# Patient Record
Sex: Male | Born: 1995 | Race: White | Hispanic: No | Marital: Single | Smoking: Never smoker
Health system: Southern US, Community
[De-identification: ages and names within clinical notes are randomized; demographics above are authoritative.]

## PROBLEM LIST (undated history)

## (undated) DIAGNOSIS — G43909 Migraine, unspecified, not intractable, without status migrainosus: Secondary | ICD-10-CM

## (undated) HISTORY — DX: Migraine, unspecified, not intractable, without status migrainosus: G43.909

---
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
## 2018-06-26 DIAGNOSIS — F4320 Adjustment disorder, unspecified: Secondary | ICD-10-CM | POA: Diagnosis not present

## 2018-07-03 DIAGNOSIS — F4320 Adjustment disorder, unspecified: Secondary | ICD-10-CM | POA: Diagnosis not present

## 2018-07-07 ENCOUNTER — Ambulatory Visit (INDEPENDENT_AMBULATORY_CARE_PROVIDER_SITE_OTHER): Payer: BLUE CROSS/BLUE SHIELD | Admitting: Family Medicine

## 2018-07-07 ENCOUNTER — Encounter: Payer: Self-pay | Admitting: Family Medicine

## 2018-07-07 VITALS — BP 102/80 | HR 68 | Temp 97.8°F | Ht 75.0 in | Wt 205.0 lb

## 2018-07-07 DIAGNOSIS — R5383 Other fatigue: Secondary | ICD-10-CM | POA: Diagnosis not present

## 2018-07-07 DIAGNOSIS — B079 Viral wart, unspecified: Secondary | ICD-10-CM | POA: Diagnosis not present

## 2018-07-07 DIAGNOSIS — Z7689 Persons encountering health services in other specified circumstances: Secondary | ICD-10-CM | POA: Diagnosis not present

## 2018-07-07 DIAGNOSIS — F32A Depression, unspecified: Secondary | ICD-10-CM

## 2018-07-07 DIAGNOSIS — F329 Major depressive disorder, single episode, unspecified: Secondary | ICD-10-CM

## 2018-07-07 DIAGNOSIS — E559 Vitamin D deficiency, unspecified: Secondary | ICD-10-CM

## 2018-07-07 DIAGNOSIS — F419 Anxiety disorder, unspecified: Secondary | ICD-10-CM | POA: Diagnosis not present

## 2018-07-07 LAB — CBC WITH DIFFERENTIAL/PLATELET
BASOS ABS: 0 10*3/uL (ref 0.0–0.1)
Basophils Relative: 0.7 % (ref 0.0–3.0)
EOS ABS: 0.1 10*3/uL (ref 0.0–0.7)
Eosinophils Relative: 2.1 % (ref 0.0–5.0)
HEMATOCRIT: 48.7 % (ref 39.0–52.0)
HEMOGLOBIN: 16.6 g/dL (ref 13.0–17.0)
LYMPHS PCT: 42.5 % (ref 12.0–46.0)
Lymphs Abs: 2.4 10*3/uL (ref 0.7–4.0)
MCHC: 34 g/dL (ref 30.0–36.0)
MCV: 85.7 fl (ref 78.0–100.0)
MONO ABS: 0.5 10*3/uL (ref 0.1–1.0)
Monocytes Relative: 8.7 % (ref 3.0–12.0)
Neutro Abs: 2.6 10*3/uL (ref 1.4–7.7)
Neutrophils Relative %: 46 % (ref 43.0–77.0)
Platelets: 242 10*3/uL (ref 150.0–400.0)
RBC: 5.68 Mil/uL (ref 4.22–5.81)
RDW: 12.7 % (ref 11.5–15.5)
WBC: 5.7 10*3/uL (ref 4.0–10.5)

## 2018-07-07 LAB — BASIC METABOLIC PANEL
BUN: 15 mg/dL (ref 6–23)
CHLORIDE: 103 meq/L (ref 96–112)
CO2: 31 mEq/L (ref 19–32)
Calcium: 9.7 mg/dL (ref 8.4–10.5)
Creatinine, Ser: 1 mg/dL (ref 0.40–1.50)
GFR: 98.59 mL/min (ref >60.00)
GLUCOSE: 94 mg/dL (ref 70–99)
POTASSIUM: 4.4 meq/L (ref 3.5–5.1)
SODIUM: 141 meq/L (ref 135–145)

## 2018-07-07 LAB — VITAMIN D 25 HYDROXY (VIT D DEFICIENCY, FRACTURES): VITD: 18.98 ng/mL — AB (ref 30.00–100.00)

## 2018-07-07 LAB — TSH: TSH: 2.33 u[IU]/mL (ref 0.35–4.50)

## 2018-07-07 LAB — T4, FREE: Free T4: 0.78 ng/dL (ref 0.60–1.60)

## 2018-07-07 MED ORDER — SERTRALINE HCL 25 MG PO TABS: ORAL_TABLET | ORAL | 1 refills | Status: DC

## 2018-07-07 MED ORDER — VITAMIN D (ERGOCALCIFEROL) 1.25 MG (50000 UNIT) PO CAPS: 50000.0000 [IU] | ORAL_CAPSULE | ORAL | 0 refills | Status: DC

## 2018-07-07 NOTE — Patient Instructions (Signed)
Cryoablation, Care After This sheet gives you information about how to care for yourself after your procedure. Your health care provider may also give you more specific instructions. If you have problems or questions, contact your health care provider. What can I expect after the procedure? After the procedure, it is common to have:  Soreness around the treatment area.  Mild pain and swelling in the treatment area.  Follow these instructions at home: Treatment area care   Follow instructions from your health care provider about how to take care of your incision. Make sure you: ? Wash your hands with soap and water before you change your bandage (dressing). If soap and water are not available, use hand sanitizer. ? Change your dressing as told by your health care provider. ? Leave stitches (sutures) in place. They may need to stay in place for 2 weeks or longer.  Check your treatment area every day for signs of infection. Check for: ? More redness, swelling, or pain. ? More fluid or blood. ? Warmth. ? Pus or a bad smell.  Keep the treated area clean, dry, and covered with a dressing until it has healed. Clean the area with soap and water or as told by your health care provider.  You may shower if your health care provider approves. If your bandage gets wet, change it right away. Activity  Follow instructions from your health care provider about any activity limitations.  Do not drive for 24 hours if you received a medicine to help you relax (sedative). General instructions  Take over-the-counter and prescription medicines only as told by your health care provider.  Keep all follow-up visits as told by your health care provider. This is important. Contact a health care provider if:  You do not have a bowel movement for 2 days.  You have nausea or vomiting.  You have more redness, swelling, or pain around your treatment area.  You have more fluid or blood coming from your  treatment area.  Your treatment area feels warm to the touch.  You have pus or a bad smell coming from your treatment area.  You have a fever. Get help right away if:  You have severe pain.  You have trouble swallowing or breathing.  You have severe weakness or dizziness.  You have chest pain or shortness of breath. This information is not intended to replace advice given to you by your health care provider. Make sure you discuss any questions you have with your health care provider. Document Released: 06/01/2013 Document Revised: 02/29/2016 Document Reviewed: 01/09/2016 Elsevier Interactive Patient Education  2018 [employer].  Living With Depression Everyone experiences occasional disappointment, sadness, and loss in their lives. When you are feeling down, blue, or sad for at least 2 weeks in a row, it may mean that you have depression. Depression can affect your thoughts and feelings, relationships, daily activities, and physical health. It is caused by changes in the way your brain functions. If you receive a diagnosis of depression, your health care provider will tell you which type of depression you have and what treatment options are available to you. If you are living with depression, there are ways to help you recover from it and also ways to prevent it from coming back. How to cope with lifestyle changes Coping with stress Stress is your body's reaction to life changes and events, both good and bad. Stressful situations may include:  Getting married.  The death of a spouse.  Losing a  job.  Arts administrator.  Having a baby.  Stress can last just a few hours or it can be ongoing. Stress can play a major role in depression, so it is important to learn both how to cope with stress and how to think about it differently. Talk with your health care provider or a counselor if you would like to learn more about stress reduction. He or she may suggest some stress reduction techniques,  such as:  Music therapy. This can include creating music or listening to music. Choose music that you enjoy and that inspires you.  Mindfulness-based meditation. This kind of meditation can be done while sitting or walking. It involves being aware of your normal breaths, rather than trying to control your breathing.  Centering prayer. This is a kind of meditation that involves focusing on a spiritual word or phrase. Choose a word, phrase, or sacred image that is meaningful to you and that brings you peace.  Deep breathing. To do this, expand your stomach and inhale slowly through your nose. Hold your breath for 3-5 seconds, then exhale slowly, allowing your stomach muscles to relax.  Muscle relaxation. This involves intentionally tensing muscles then relaxing them.  Choose a stress reduction technique that fits your lifestyle and personality. Stress reduction techniques take time and practice to develop. Set aside 5-15 minutes a day to do them. Therapists can offer training in these techniques. The training may be covered by some insurance plans. Other things you can do to manage stress include:  Keeping a stress diary. This can help you learn what triggers your stress and ways to control your response.  Understanding what your limits are and saying no to requests or events that lead to a schedule that is too full.  Thinking about how you respond to certain situations. You may not be able to control everything, but you can control how you react.  Adding humor to your life by watching funny films or TV shows.  Making time for activities that help you relax and not feeling guilty about spending your time this way.  Medicines Your health care provider may suggest certain medicines if he or she feels that they will help improve your condition. Avoid using alcohol and other substances that may prevent your medicines from working properly (may interact). It is also important to:  Talk with your  pharmacist or health care provider about all the medicines that you take, their possible side effects, and what medicines are safe to take together.  Make it your goal to take part in all treatment decisions (shared decision-making). This includes giving input on the side effects of medicines. It is best if shared decision-making with your health care provider is part of your total treatment plan.  If your health care provider prescribes a medicine, you may not notice the full benefits of it for 4-8 weeks. Most people who are treated for depression need to be on medicine for at least 6-12 months after they feel better. If you are taking medicines as part of your treatment, do not stop taking medicines without first talking to your health care provider. You may need to have the medicine slowly decreased (tapered) over time to decrease the risk of harmful side effects. Relationships Your health care provider may suggest family therapy along with individual therapy and drug therapy. While there may not be family problems that are causing you to feel depressed, it is still important to make sure your family learns as much as they  can about your mental health. Having your family's support can help make your treatment successful. How to recognize changes in your condition Everyone has a different response to treatment for depression. Recovery from major depression happens when you have not had signs of major depression for two months. This may mean that you will start to:  Have more interest in doing activities.  Feel less hopeless than you did 2 months ago.  Have more energy.  Overeat less often, or have better or improving appetite.  Have better concentration.  Your health care provider will work with you to decide the next steps in your recovery. It is also important to recognize when your condition is getting worse. Watch for these signs:  Having fatigue or low energy.  Eating too much or too  little.  Sleeping too much or too little.  Feeling restless, agitated, or hopeless.  Having trouble concentrating or making decisions.  Having unexplained physical complaints.  Feeling irritable, angry, or aggressive.  Get help as soon as you or your family members notice these symptoms coming back. How to get support and help from others How to talk with friends and family members about your condition Talking to friends and family members about your condition can provide you with one way to get support and guidance. Reach out to trusted friends or family members, explain your symptoms to them, and let them know that you are working with a health care provider to treat your depression. Financial resources Not all insurance plans cover mental health care, so it is important to check with your insurance carrier. If paying for co-pays or counseling services is a problem, search for a local or county mental health care center. They may be able to offer public mental health care services at low or no cost when you are not able to see a private health care provider. If you are taking medicine for depression, you may be able to get the generic form, which may be less expensive. Some makers of prescription medicines also offer help to patients who cannot afford the medicines they need. Follow these instructions at home:  Get the right amount and quality of sleep.  Cut down on using caffeine, tobacco, alcohol, and other potentially harmful substances.  Try to exercise, such as walking or lifting small weights.  Take over-the-counter and prescription medicines only as told by your health care provider.  Eat a healthy diet that includes plenty of vegetables, fruits, whole grains, low-fat dairy products, and lean protein. Do not eat a lot of foods that are high in solid fats, added sugars, or salt.  Keep all follow-up visits as told by your health care provider. This is important. Contact a  health care provider if:  You stop taking your antidepressant medicines, and you have any of these symptoms: ? Nausea. ? Headache. ? Feeling lightheaded. ? Chills and body aches. ? Not being able to sleep (insomnia).  You or your friends and family think your depression is getting worse. Get help right away if:  You have thoughts of hurting yourself or others. If you ever feel like you may hurt yourself or others, or have thoughts about taking your own life, get help right away. You can go to your nearest emergency department or call:  Your local emergency services (911 in the [place]  A suicide crisis helpline, such as the National Suicide Prevention Lifeline at 579-170-91241-903-381-6719. This is open 24-hours a day.  Summary  If you are living with  depression, there are ways to help you recover from it and also ways to prevent it from coming back.  Work with your health care team to create a management plan that includes counseling, stress management techniques, and healthy lifestyle habits. This information is not intended to replace advice given to you by your health care provider. Make sure you discuss any questions you have with your health care provider. Document Released: 07/14/2016 Document Revised: 07/14/2016 Document Reviewed: 07/14/2016 Elsevier Interactive Patient Education  2018 [employer].  Living With Anxiety After being diagnosed with an anxiety disorder, you may be relieved to know why you have felt or behaved a certain way. It is natural to also feel overwhelmed about the treatment ahead and what it will mean for your life. With care and support, you can manage this condition and recover from it. How to cope with anxiety Dealing with stress Stress is your body's reaction to life changes and events, both good and bad. Stress can last just a few hours or it can be ongoing. Stress can play a major role in anxiety, so it is important to learn both how to cope with stress and how  to think about it differently. Talk with your health care provider or a counselor to learn more about stress reduction. He or she may suggest some stress reduction techniques, such as:  Music therapy. This can include creating or listening to music that you enjoy and that inspires you.  Mindfulness-based meditation. This involves being aware of your normal breaths, rather than trying to control your breathing. It can be done while sitting or walking.  Centering prayer. This is a kind of meditation that involves focusing on a word, phrase, or sacred image that is meaningful to you and that brings you peace.  Deep breathing. To do this, expand your stomach and inhale slowly through your nose. Hold your breath for 3-5 seconds. Then exhale slowly, allowing your stomach muscles to relax.  Self-talk. This is a skill where you identify thought patterns that lead to anxiety reactions and correct those thoughts.  Muscle relaxation. This involves tensing muscles then relaxing them.  Choose a stress reduction technique that fits your lifestyle and personality. Stress reduction techniques take time and practice. Set aside 5-15 minutes a day to do them. Therapists can offer training in these techniques. The training may be covered by some insurance plans. Other things you can do to manage stress include:  Keeping a stress diary. This can help you learn what triggers your stress and ways to control your response.  Thinking about how you respond to certain situations. You may not be able to control everything, but you can control your reaction.  Making time for activities that help you relax, and not feeling guilty about spending your time in this way.  Therapy combined with coping and stress-reduction skills provides the best chance for successful treatment. Medicines Medicines can help ease symptoms. Medicines for anxiety include:  Anti-anxiety drugs.  Antidepressants.  Beta-blockers.  Medicines  may be used as the main treatment for anxiety disorder, along with therapy, or if other treatments are not working. Medicines should be prescribed by a health care provider. Relationships Relationships can play a big part in helping you recover. Try to spend more time connecting with trusted friends and family members. Consider going to couples counseling, taking family education classes, or going to family therapy. Therapy can help you and others better understand the condition. How to recognize changes in your condition  Everyone has a different response to treatment for anxiety. Recovery from anxiety happens when symptoms decrease and stop interfering with your daily activities at home or work. This may mean that you will start to:  Have better concentration and focus.  Sleep better.  Be less irritable.  Have more energy.  Have improved memory.  It is important to recognize when your condition is getting worse. Contact your health care provider if your symptoms interfere with home or work and you do not feel like your condition is improving. Where to find help and support: You can get help and support from these sources:  Self-help groups.  Online and [employer].  A trusted spiritual leader.  Couples counseling.  Family education classes.  Family therapy.  Follow these instructions at home:  Eat a healthy diet that includes plenty of vegetables, fruits, whole grains, low-fat dairy products, and lean protein. Do not eat a lot of foods that are high in solid fats, added sugars, or salt.  Exercise. Most adults should do the following: ? Exercise for at least 150 minutes each week. The exercise should increase your heart rate and make you sweat (moderate-intensity exercise). ? Strengthening exercises at least twice a week.  Cut down on caffeine, tobacco, alcohol, and other potentially harmful substances.  Get the right amount and quality of sleep. Most adults need  7-9 hours of sleep each night.  Make choices that simplify your life.  Take over-the-counter and prescription medicines only as told by your health care provider.  Avoid caffeine, alcohol, and certain over-the-counter cold medicines. These may make you feel worse. Ask your pharmacist which medicines to avoid.  Keep all follow-up visits as told by your health care provider. This is important. Questions to ask your health care provider  Would I benefit from therapy?  How often should I follow up with a health care provider?  How long do I need to take medicine?  Are there any long-term side effects of my medicine?  Are there any alternatives to taking medicine? Contact a health care provider if:  You have a hard time staying focused or finishing daily tasks.  You spend many hours a day feeling worried about everyday life.  You become exhausted by worry.  You start to have headaches, feel tense, or have nausea.  You urinate more than normal.  You have diarrhea. Get help right away if:  You have a racing heart and shortness of breath.  You have thoughts of hurting yourself or others. If you ever feel like you may hurt yourself or others, or have thoughts about taking your own life, get help right away. You can go to your nearest emergency department or call:  Your local emergency services (911 in the [place]  A suicide crisis helpline, such as the National Suicide Prevention Lifeline at 216-269-5240. This is open 24-hours a day.  Summary  Taking steps to deal with stress can help calm you.  Medicines cannot cure anxiety disorders, but they can help ease symptoms.  Family, friends, and partners can play a big part in helping you recover from an anxiety disorder. This information is not intended to replace advice given to you by your health care provider. Make sure you discuss any questions you have with your health care provider. Document Released: 08/05/2016 Document  Revised: 08/05/2016 Document Reviewed: 08/05/2016 Elsevier Interactive Patient Education  [employer].

## 2018-07-07 NOTE — Progress Notes (Signed)
Patient presents to clinic today to establish care.  SUBJECTIVE: PMH: Patient is a 22 year old male with pmh sig for anxiety, depression.  Pt unsure of name of last provider.  Depression: -endorses dealing with symptoms since age 32 -Endorses paternal grandmother had history of depression -Pt has never been on medication -endorses increased sleeping, decreased energy, decreased mood, increased alcohol use, thinking he was better off dead. -denies active SI/HI. -notes history of suicidal thoughts a few years ago. -Pt endorses stress at work. -Started counseling at [facility] with Terri or Salvadore Oxford.  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  No causing pain, but pt does not like it  -would like wart on L 3 rd digit removed -has large mole on R side, present since birth, has not changed but would like removed.  Allergies:  Pt states he thinks he has an allergy to Zofran, but is unsure  Social history: Pt is single.  He works as a Training and development officer.  Pt endorses alcohol use maybe 3 times per week.  Pt states he is cut down as during the month of May he would drink every day.  Pt endorses social nicotine use.  States her friend has a cigarette he may smoke then.  Pt endorses marijuana use 3 times per week.  Past Medical History:  Diagnosis Date  . Migraine     History reviewed. No pertinent surgical history.  No current outpatient medications on file prior to visit.   No current facility-administered medications on file prior to visit.     No Known Allergies  Family History  Problem Relation Age of Onset  . Cancer Paternal Grandfather     Social History   Socioeconomic History  . Marital status: Single    Spouse name: Not on file  . Number of children: Not on file  . Years of education: Not on file  . Highest education level: Not on file  Occupational History  . Not on file  Social Needs  . Financial resource strain: Not on file  .  Food insecurity:    Worry: Not on file    Inability: Not on file  . Transportation needs:    Medical: Not on file    Non-medical: Not on file  Tobacco Use  . Smoking status: Never Smoker  . Smokeless tobacco: Never Used  Substance and Sexual Activity  . Alcohol use: Yes    Alcohol/week: 0.0 standard drinks  . Drug use: No  . Sexual activity: Yes  Lifestyle  . Physical activity:    Days per week: Not on file    Minutes per session: Not on file  . Stress: Not on file  Relationships  . Social connections:    Talks on phone: Not on file    Gets together: Not on file    Attends religious service: Not on file    Active member of club or organization: Not on file    Attends meetings of clubs or organizations: Not on file    Relationship status: Not on file  . Intimate partner violence:    Fear of current or ex partner: Not on file    Emotionally abused: Not on file    Physically abused: Not on file    Forced sexual activity: Not on file  Other Topics Concern  . Not on file  Social History Narrative  . Not on file    ROS General: Denies fever, chills, night sweats, changes in weight, changes  in appetite  +decreased energy HEENT: Denies headaches, ear pain, changes in vision, rhinorrhea, sore throat CV: Denies CP, palpitations, SOB, orthopnea Pulm: Denies SOB, cough, wheezing GI: Denies abdominal pain, nausea, vomiting, diarrhea, constipation GU: Denies dysuria, hematuria, frequency, vaginal discharge Msk: Denies muscle cramps, joint pains Neuro: Denies weakness, numbness, tingling Skin: Denies rashes, bruising  +nevi on R side and wart. Psych: Denies hallucinations   +anxiety and depression   BP 102/80 (BP Location: Left Arm, Patient Position: Sitting, Cuff Size: Normal)   Pulse 68   Temp 97.8 F (36.6 C) (Oral)   Ht 6\' 3"  (1.905 m)   Wt 205 lb (93 kg)   SpO2 98%   BMI 25.62 kg/m   Physical Exam Gen. Pleasant, well developed, well-nourished, in NAD HEENT -  Zinc/AT, PERRL, EOMI, no scleral icterus, no nasal drainage, pharynx without erythema or exudate. Lungs: no use of accessory muscles, CTAB, no wheezes, rales or rhonchi Cardiovascular: RRR, No r/g/m, no peripheral edema Musculoskeletal: No deformities, moves all four extremities, no cyanosis or clubbing, normal tone Neuro:  A&Ox3, CN II-XII intact, normal gait Skin:  Warm, dry, intact.  L 3rd digit with verrucae on lateral side.  3 cm mole on R flank.  No results found for this or any previous visit (from the past 2160 hour(s)).  Assessment/Plan: Anxiety and depression  -PHQ9 scroe 9 -GAD 7 score 16.  -discussed starting SSRI if labs negative.  Will consider sertraline or paroxetine -given handout for area psychiatrist, encouraged to make appointment. - Plan: Basic metabolic panel, TSH, T4, Free  Decreased energy  - Plan: CBC with Differential/Platelet, Vitamin D, 25-hydroxy  Viral wart on finger -consent obtained.  Liquid nitrogen used on wart.  Pt tolerated procedure well. -discussed may need an additional treatment. -given handout.  Discussed aftercare  Encounter to establish care -We reviewed the PMH, PSH, FH, SH, Meds and Allergies. -We provided refills for any medications we will prescribe as needed. -We addressed current concerns per orders and patient instructions. -We have asked for records for pertinent exams, studies, vaccines and notes from previous providers. -We have advised patient to follow up per instructions below.  Discussed possible referral to Derm for nevi removal.  Update:  Vit D low at 18.98.  Will send in rx for Ergocalciferol 50,000 IU x 12 wks.  Other labs normal.  Will start sertraline 25 mg x 1 wk, then increase to 50 mg daily.  F/u in 6 wks  Abbe AmsterdamShannon Salbador Fiveash, MD

## 2018-07-31 DIAGNOSIS — F4320 Adjustment disorder, unspecified: Secondary | ICD-10-CM | POA: Diagnosis not present

## 2018-08-21 DIAGNOSIS — F4320 Adjustment disorder, unspecified: Secondary | ICD-10-CM | POA: Diagnosis not present

## 2018-08-27 ENCOUNTER — Ambulatory Visit (INDEPENDENT_AMBULATORY_CARE_PROVIDER_SITE_OTHER): Payer: BLUE CROSS/BLUE SHIELD | Admitting: Family Medicine

## 2018-08-27 ENCOUNTER — Encounter: Payer: Self-pay | Admitting: Family Medicine

## 2018-08-27 VITALS — BP 120/84 | HR 77 | Temp 98.4°F | Ht 75.0 in | Wt 218.0 lb

## 2018-08-27 DIAGNOSIS — F329 Major depressive disorder, single episode, unspecified: Secondary | ICD-10-CM

## 2018-08-27 DIAGNOSIS — E559 Vitamin D deficiency, unspecified: Secondary | ICD-10-CM

## 2018-08-27 DIAGNOSIS — F419 Anxiety disorder, unspecified: Secondary | ICD-10-CM | POA: Diagnosis not present

## 2018-08-27 DIAGNOSIS — F32A Depression, unspecified: Secondary | ICD-10-CM

## 2018-08-27 NOTE — Patient Instructions (Signed)
Vitamin D Deficiency Vitamin D deficiency is when your body does not have enough vitamin D. Vitamin D is important to your body for many reasons:  It helps the body to absorb two important minerals, called calcium and phosphorus.  It plays a role in bone health.  It may help to prevent some diseases, such as diabetes and multiple sclerosis.  It plays a role in muscle function, including heart function. You can get vitamin D by:  Eating foods that naturally contain vitamin D.  Eating or drinking milk or other dairy products that have vitamin D added to them.  Taking a vitamin D supplement or a multivitamin supplement that contains vitamin D.  Being in the sun. Your body naturally makes vitamin D when your skin is exposed to sunlight. Your body changes the sunlight into a form of the vitamin that the body can use. If vitamin D deficiency is severe, it can cause a condition in which your bones become soft. In adults, this condition is called osteomalacia. In children, this condition is called rickets. What are the causes? Vitamin D deficiency may be caused by:  Not eating enough foods that contain vitamin D.  Not getting enough sun exposure.  Having certain digestive system diseases that make it difficult for your body to absorb vitamin D. These diseases include Crohn disease, chronic pancreatitis, and cystic fibrosis.  Having a surgery in which a part of the stomach or a part of the small intestine is removed.  Being obese.  Having chronic kidney disease or liver disease. What increases the risk? This condition is more likely to develop in:  Older people.  People who do not spend much time outdoors.  People who live in a long-term care facility.  People who have had broken bones.  People with weak or thin bones (osteoporosis).  People who have a disease or condition that changes how the body absorbs vitamin D.  People who have dark skin.  People who take certain  medicines, such as steroid medicines or certain seizure medicines.  People who are overweight or obese. What are the signs or symptoms? In mild cases of vitamin D deficiency, there may not be any symptoms. If the condition is severe, symptoms may include:  Bone pain.  Muscle pain.  Falling often.  Broken bones caused by a minor injury. How is this diagnosed? This condition is usually diagnosed with a blood test. How is this treated? Treatment for this condition may depend on what caused the condition. Treatment options include:  Taking vitamin D supplements.  Taking a calcium supplement. Your health care provider will suggest what dose is best for you. Follow these instructions at home:  Take medicines and supplements only as told by your health care provider.  Eat foods that contain vitamin D. Choices include: ? Fortified dairy products, cereals, or juices. Fortified means that vitamin D has been added to the food. Check the label on the package to be sure. ? Fatty fish, such as salmon or trout. ? Eggs. ? Oysters.  Do not use a tanning bed.  Maintain a healthy weight. Lose weight, if needed.  Keep all follow-up visits as told by your health care provider. This is important. Contact a health care provider if:  Your symptoms do not go away.  You feel like throwing up (nausea) or you throw up (vomit).  You have fewer bowel movements than usual or it is difficult for you to have a bowel movement (constipation). This information is   not intended to replace advice given to you by your health care provider. Make sure you discuss any questions you have with your health care provider. Document Released: 11/03/2011 Document Revised: 01/23/2016 Document Reviewed: 12/27/2014 Elsevier Interactive Patient Education  2019 Elsevier Inc.  

## 2018-08-27 NOTE — Progress Notes (Signed)
Subjective:    Patient ID: Andrew Hensley, male    DOB: 27-Aug-1995, 23 y.o.   MRN: [id number]  Chief Complaint  Patient presents with  . Follow-up    anxiety and depression    HPI Patient was seen today for f/u.  Pt was started on sertraline 50 mg after normal labs at last [facility] 07/07/18.  Pt was also started on ergocalciferol 50,000 IU.  Pt feels the Vit D has made more of a difference than the sertraline.   Pt would like to stop sertraline.  Pt notes improved mood, sleep is still "so-so", and energy is improving.  Pt notes a few friends have noticed a difference in his mood. He is still in counseling but has yet to find a psychiatrist.  Past Medical History:  Diagnosis Date  . Migraine     No Known Allergies  ROS General: Denies fever, chills, night sweats, changes in weight, changes in appetite HEENT: Denies headaches, ear pain, changes in vision, rhinorrhea, sore throat CV: Denies CP, palpitations, SOB, orthopnea Pulm: Denies SOB, cough, wheezing GI: Denies abdominal pain, nausea, vomiting, diarrhea, constipation GU: Denies dysuria, hematuria, frequency, vaginal discharge Msk: Denies muscle cramps, joint pains Neuro: Denies weakness, numbness, tingling Skin: Denies rashes, bruising Psych: Denies hallucinations  +anxiety and depression     Objective:    Blood pressure 120/84, pulse 77, temperature 98.4 F (36.9 C), height 6\' 3"  (1.905 m), weight 218 lb (98.9 kg), SpO2 98 %.  Gen. Pleasant, well-nourished, in no distress, normal affect  HEENT: Mar-Mac/AT, face symmetric,  no scleral icterus, PERRLA, nares patent without drainage Lungs: no accessory muscle use Cardiovascular: RRR,  no peripheral edema Neuro:  A&Ox3, CN II-XII intact, normal gait  Wt Readings from Last 3 Encounters:  08/27/18 218 lb (98.9 kg)  07/07/18 205 lb (93 kg)  10/24/14 183 lb (83 kg) (85 %, Z= 1.03)*   * Growth percentiles are based on CDC (Boys, 2-20 Years) data.    Lab Results  Component  Value Date   WBC 5.7 07/07/2018   HGB 16.6 07/07/2018   HCT 48.7 07/07/2018   PLT 242.0 07/07/2018   GLUCOSE 94 07/07/2018   NA 141 07/07/2018   K 4.4 07/07/2018   CL 103 07/07/2018   CREATININE 1.00 07/07/2018   BUN 15 07/07/2018   CO2 31 07/07/2018   TSH 2.33 07/07/2018    Assessment/Plan:  Anxiety and depression -PHQ 9 score 8.  Was 9 on 07/07/18 -Gad 7 score 3.  Was 16 on 07/07/2018 -Discussed decreasing sertraline dose to 25 mg daily x a few wks as patient would like to stop taking it. -Patient encouraged to continue looking into psychiatric providers. -Continue therapy  Vitamin D deficiency -Vitamin D 18.98 on 07/07/2018 -Continue ergocalciferol 50,000 IU -After completion of 50,000 IU start OTC vitamin D 2000 IU daily  F/u in 4-6 wks  Abbe Amsterdam, MD

## 2018-08-31 ENCOUNTER — Other Ambulatory Visit: Payer: Self-pay | Admitting: Family Medicine

## 2018-08-31 DIAGNOSIS — F419 Anxiety disorder, unspecified: Principal | ICD-10-CM

## 2018-08-31 DIAGNOSIS — F32A Depression, unspecified: Secondary | ICD-10-CM

## 2018-08-31 DIAGNOSIS — F329 Major depressive disorder, single episode, unspecified: Secondary | ICD-10-CM

## 2018-09-21 ENCOUNTER — Other Ambulatory Visit: Payer: Self-pay | Admitting: Family Medicine

## 2018-09-21 DIAGNOSIS — E559 Vitamin D deficiency, unspecified: Secondary | ICD-10-CM

## 2018-09-21 NOTE — Telephone Encounter (Signed)
This Rx was a one time refill pt is taking OTC vit D

## 2018-10-01 DIAGNOSIS — F321 Major depressive disorder, single episode, moderate: Secondary | ICD-10-CM | POA: Diagnosis not present

## 2018-10-03 DIAGNOSIS — F4320 Adjustment disorder, unspecified: Secondary | ICD-10-CM | POA: Diagnosis not present

## 2018-10-13 DIAGNOSIS — F321 Major depressive disorder, single episode, moderate: Secondary | ICD-10-CM | POA: Diagnosis not present

## 2018-10-15 DIAGNOSIS — F321 Major depressive disorder, single episode, moderate: Secondary | ICD-10-CM | POA: Diagnosis not present

## 2018-10-22 DIAGNOSIS — F321 Major depressive disorder, single episode, moderate: Secondary | ICD-10-CM | POA: Diagnosis not present

## 2018-10-23 ENCOUNTER — Other Ambulatory Visit: Payer: Self-pay | Admitting: Family Medicine

## 2018-10-23 DIAGNOSIS — E559 Vitamin D deficiency, unspecified: Secondary | ICD-10-CM

## 2018-10-25 NOTE — Telephone Encounter (Signed)
This Rx was a one time dose and then pt should continue with OTC vit D

## 2018-10-27 DIAGNOSIS — F321 Major depressive disorder, single episode, moderate: Secondary | ICD-10-CM | POA: Diagnosis not present

## 2018-12-27 DIAGNOSIS — F321 Major depressive disorder, single episode, moderate: Secondary | ICD-10-CM | POA: Diagnosis not present

## 2019-03-17 DIAGNOSIS — Z1159 Encounter for screening for other viral diseases: Secondary | ICD-10-CM | POA: Diagnosis not present

## 2019-03-29 DIAGNOSIS — F411 Generalized anxiety disorder: Secondary | ICD-10-CM | POA: Diagnosis not present

## 2019-04-04 DIAGNOSIS — F411 Generalized anxiety disorder: Secondary | ICD-10-CM | POA: Diagnosis not present

## 2019-04-13 DIAGNOSIS — F411 Generalized anxiety disorder: Secondary | ICD-10-CM | POA: Diagnosis not present

## 2019-04-18 DIAGNOSIS — F411 Generalized anxiety disorder: Secondary | ICD-10-CM | POA: Diagnosis not present

## 2019-04-28 ENCOUNTER — Telehealth: Payer: Self-pay

## 2019-04-28 NOTE — Telephone Encounter (Signed)
lvm for pt to call back to get patient a virtual follow up visit . crm created

## 2019-05-27 ENCOUNTER — Other Ambulatory Visit: Payer: Self-pay

## 2019-05-27 ENCOUNTER — Telehealth (INDEPENDENT_AMBULATORY_CARE_PROVIDER_SITE_OTHER): Payer: BC Managed Care – PPO | Admitting: Family Medicine

## 2019-05-27 DIAGNOSIS — L989 Disorder of the skin and subcutaneous tissue, unspecified: Secondary | ICD-10-CM

## 2019-05-27 DIAGNOSIS — F329 Major depressive disorder, single episode, unspecified: Secondary | ICD-10-CM | POA: Diagnosis not present

## 2019-05-27 DIAGNOSIS — L709 Acne, unspecified: Secondary | ICD-10-CM

## 2019-05-27 DIAGNOSIS — F419 Anxiety disorder, unspecified: Secondary | ICD-10-CM | POA: Diagnosis not present

## 2019-05-27 DIAGNOSIS — F32A Depression, unspecified: Secondary | ICD-10-CM

## 2019-05-27 MED ORDER — ESCITALOPRAM OXALATE 10 MG PO TABS: 10.0000 mg | ORAL_TABLET | Freq: Every day | ORAL | 2 refills | Status: DC

## 2019-05-27 NOTE — Progress Notes (Signed)
Virtual Visit via Video Note  I connected with Andrew Hensley on 05/27/19 at  8:30 AM EDT by a video enabled telemedicine application 2/2 COVID-19 pandemic and verified that I am speaking with the correct person using two identifiers.  Location patient: home Location provider:work or home office Persons participating in the virtual visit: patient, provider  I discussed the limitations of evaluation and management by telemedicine and the availability of in person appointments. The patient expressed understanding and agreed to proceed.   HPI: Pt is a 23 yo male with pmh sig for migraines, anxiety, and depression.  Pt feeling down and increased anxiety.  Pt weaned himself off zoloft after last OFV.  Pt's sleep is broken.  States feels "fully rested yet tired at the same time".  Concentration is so so, like a 5/10.  Energy is ok.  Was taking Vitamin D, which helped energy, but now does not notice a difference.  Pt is working from home.  Tries to take breaks and go on walks.  Pt had 4-5 sessions with a therapist in January.  He recently restarted therapy in July with a new provider.  Pt states he feels good during the sessions, but goes back to feeling the same after them.  Pt states he does not want to feel like his is wasting his money.  Pt notes family h/o anxiety and depression (grandmother).  Pt denies SI/HI.   Pt requesting derm referral.  Has a mole on his R flank that he would like checked.  Also notes a bump superior to gluteal cleft.  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   States does not like the way it looks.  Area is not painful.  ROS: See pertinent positives and negatives per HPI.  Past Medical History:  Diagnosis Date  . Migraine     No past surgical history on file.  Family History  Problem Relation Age of Onset  . Cancer Paternal Grandfather       Current Outpatient Medications:  .  sertraline (ZOLOFT) 25 MG tablet, TAKE 1 TABLET BY MOUTH DAILY X 1 WEEK. THEN  INCREASE TO 2 TABLETS DAILY THEREAFTER, Disp: 60 tablet, Rfl: 1 .  Vitamin D, Ergocalciferol, (DRISDOL) 1.25 MG (50000 UT) CAPS capsule, Take 1 capsule (50,000 Units total) by mouth every 7 (seven) days., Disp: 12 capsule, Rfl: 0  EXAM:  VITALS per patient if applicable:  GENERAL: alert, oriented, appears well and in no acute distress  HEENT: atraumatic, conjunctiva clear, no obvious abnormalities on inspection of external nose and ears  NECK: normal movements of the head and neck  LUNGS: on inspection no signs of respiratory distress, breathing rate appears normal, no obvious gross SOB, gasping or wheezing  CV: no obvious cyanosis  MS: moves all visible extremities without noticeable abnormality  PSYCH/NEURO: pleasant and cooperative, no obvious depression or anxiety, speech and thought processing grossly intact  ASSESSMENT AND PLAN:  Discussed the following assessment and plan:  Anxiety and depression  -increasing since d/c'ing zoloft -will start lexapro 10 mg -continue therapy -Follow up with Psychiatry advised. - Plan: escitalopram (LEXAPRO) 10 MG tablet -f/u in 4-6 wks, sooner if needed  Acne, unspecified acne type  -mostly on back -advised to use a gentle cleanser  - Plan: Ambulatory referral to Dermatology  Skin lesions  -Areas of concern noted during previous OFV.  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 -Attempted to explain no indication to remove xyphoid process. - Plan:  Ambulatory referral to Dermatology   I discussed the assessment and treatment plan with the patient. The patient was provided an opportunity to ask questions and all were answered. The patient agreed with the plan and demonstrated an understanding of the instructions.   The patient was advised to call back or seek an in-person evaluation if the symptoms worsen or if the condition fails to improve as anticipated.   Billie Ruddy, MD

## 2019-06-19 ENCOUNTER — Other Ambulatory Visit: Payer: Self-pay | Admitting: Family Medicine

## 2019-06-19 DIAGNOSIS — F329 Major depressive disorder, single episode, unspecified: Secondary | ICD-10-CM

## 2019-06-19 DIAGNOSIS — F419 Anxiety disorder, unspecified: Secondary | ICD-10-CM

## 2019-06-19 DIAGNOSIS — F32A Depression, unspecified: Secondary | ICD-10-CM

## 2019-06-21 ENCOUNTER — Telehealth: Payer: Self-pay

## 2019-06-21 NOTE — Telephone Encounter (Signed)
Copied from [place] [id number]. Topic: Referral - Status >> Jun 20, 2019  4:55 PM Erick Blinks wrote: Reason for CRM: Pt called to inquire about dermatology referral. Please advise

## 2019-06-23 NOTE — Telephone Encounter (Signed)
LVM for pt to call [facility] office and check the hold up on pt appointment scheduling

## 2019-07-06 DIAGNOSIS — Z03818 Encounter for observation for suspected exposure to other biological agents ruled out: Secondary | ICD-10-CM | POA: Diagnosis not present

## 2019-07-07 DIAGNOSIS — Z20828 Contact with and (suspected) exposure to other viral communicable diseases: Secondary | ICD-10-CM | POA: Diagnosis not present

## 2019-08-01 DIAGNOSIS — D485 Neoplasm of uncertain behavior of skin: Secondary | ICD-10-CM | POA: Diagnosis not present

## 2019-08-01 DIAGNOSIS — D225 Melanocytic nevi of trunk: Secondary | ICD-10-CM | POA: Diagnosis not present

## 2019-08-01 DIAGNOSIS — L600 Ingrowing nail: Secondary | ICD-10-CM | POA: Diagnosis not present

## 2019-08-01 DIAGNOSIS — B360 Pityriasis versicolor: Secondary | ICD-10-CM | POA: Diagnosis not present

## 2019-08-01 DIAGNOSIS — L700 Acne vulgaris: Secondary | ICD-10-CM | POA: Diagnosis not present

## 2019-08-01 DIAGNOSIS — D1801 Hemangioma of skin and subcutaneous tissue: Secondary | ICD-10-CM | POA: Diagnosis not present

## 2019-08-08 DIAGNOSIS — F411 Generalized anxiety disorder: Secondary | ICD-10-CM | POA: Diagnosis not present

## 2019-08-11 ENCOUNTER — Telehealth: Payer: Self-pay | Admitting: Family Medicine

## 2019-08-11 DIAGNOSIS — F419 Anxiety disorder, unspecified: Secondary | ICD-10-CM

## 2019-08-11 DIAGNOSIS — F329 Major depressive disorder, single episode, unspecified: Secondary | ICD-10-CM

## 2019-08-11 DIAGNOSIS — F32A Depression, unspecified: Secondary | ICD-10-CM

## 2019-08-15 DIAGNOSIS — F411 Generalized anxiety disorder: Secondary | ICD-10-CM | POA: Diagnosis not present

## 2019-08-15 NOTE — Telephone Encounter (Signed)
Rx was sent for refill on 08/11/2019, pt requesting to increase the dose to Lexapro 20 mg

## 2019-08-15 NOTE — Telephone Encounter (Signed)
Message Routed to PCP CMA 

## 2019-08-15 NOTE — Telephone Encounter (Signed)
Pt is requesting increase in dosage of escitalopram (LEXAPRO) 10 MG tablet  To 20MG . Stated it is helping but he thinks increase will help more. Please advise.

## 2019-08-16 NOTE — Telephone Encounter (Signed)
Left a detailed message for pt with Dr Volanda Napoleon advise to call office and schedule appointment in one month from now.

## 2019-09-22 ENCOUNTER — Encounter: Payer: Self-pay | Admitting: Family Medicine

## 2019-09-22 ENCOUNTER — Other Ambulatory Visit: Payer: Self-pay

## 2019-09-22 ENCOUNTER — Ambulatory Visit (INDEPENDENT_AMBULATORY_CARE_PROVIDER_SITE_OTHER): Payer: BC Managed Care – PPO | Admitting: Family Medicine

## 2019-09-22 VITALS — BP 102/78 | HR 70 | Temp 97.5°F | Wt 218.0 lb

## 2019-09-22 DIAGNOSIS — F329 Major depressive disorder, single episode, unspecified: Secondary | ICD-10-CM | POA: Diagnosis not present

## 2019-09-22 DIAGNOSIS — B079 Viral wart, unspecified: Secondary | ICD-10-CM | POA: Diagnosis not present

## 2019-09-22 DIAGNOSIS — F419 Anxiety disorder, unspecified: Secondary | ICD-10-CM | POA: Diagnosis not present

## 2019-09-22 DIAGNOSIS — F32A Depression, unspecified: Secondary | ICD-10-CM | POA: Insufficient documentation

## 2019-09-22 MED ORDER — ESCITALOPRAM OXALATE 20 MG PO TABS: 20.0000 mg | ORAL_TABLET | Freq: Every day | ORAL | 2 refills | Status: DC

## 2019-09-22 NOTE — Progress Notes (Signed)
Subjective:    Patient ID: Andrew Hensley, male    DOB: [birth year], 24 y.o.   MRN: [id number]  No chief complaint on file.   HPI Patient was seen today for f/u on anxiety and depression. Taking lexapro 10 mg daily.  Notes some improvement, but feels like could be better.  Denies difficulty falling asleep, but may wake up 2-3 x per night.  Able to fall back asleep.  Awakes rested in the am.  Working from home, able to complete tasks.  Trying to eat healthier.  In counseling.  Pt also with two warts on L 3rd digit.  Past Medical History:  Diagnosis Date  . Migraine     No Known Allergies  ROS General: Denies fever, chills, night sweats, changes in weight, changes in appetite HEENT: Denies headaches, ear pain, changes in vision, rhinorrhea, sore throat CV: Denies CP, palpitations, SOB, orthopnea Pulm: Denies SOB, cough, wheezing GI: Denies abdominal pain, nausea, vomiting, diarrhea, constipation GU: Denies dysuria, hematuria, frequency, vaginal discharge Msk: Denies muscle cramps, joint pains Neuro: Denies weakness, numbness, tingling Skin: Denies rashes, bruising  +wart on finger Psych: Denies hallucinations +anxiety and depression    Objective:    Blood pressure 102/78, pulse 70, temperature (!) 97.5 F (36.4 C), temperature source Temporal, weight 218 lb (98.9 kg), SpO2 98 %.   Gen. Pleasant, well-nourished, in no distress, normal affect   HEENT: Lithium/AT, face symmetric, no scleral icterus, PERRLA, EOMI, nares patent without drainage Lungs: no accessory muscle use, CTAB, no wheezes or rales Cardiovascular: RRR, no peripheral edema Neuro:  A&Ox3, CN II-XII intact, normal gait Skin:  Warm, no rash  L 3rd digit with 2 warts, the largest on the lateral side of finger and a small one on palmer surface at PIP joint.  Wt Readings from Last 3 Encounters:  09/22/19 218 lb (98.9 kg)  08/27/18 218 lb (98.9 kg)  07/07/18 205 lb (93 kg)    Lab Results  Component Value Date   WBC 5.7 07/07/2018   HGB 16.6 07/07/2018   HCT 48.7 07/07/2018   PLT 242.0 07/07/2018   GLUCOSE 94 07/07/2018   NA 141 07/07/2018   K 4.4 07/07/2018   CL 103 07/07/2018   CREATININE 1.00 07/07/2018   BUN 15 07/07/2018   CO2 31 07/07/2018   TSH 2.33 07/07/2018    Assessment/Plan:  Anxiety and depression  -PHQ 9 score 15 -GAD 7 score 12 -discussed increasing lexapro from 10 mg to 20 mg. -continue counseling. -given precautions -f/u in 4-6 wks - Plan: escitalopram (LEXAPRO) 20 MG tablet  Viral wart on finger -consent obtained.  Liquid nitrogen applied.  Pt tolerated procedure well.  F/u in 4-6 wks  Abbe Amsterdam, MD

## 2019-09-22 NOTE — Patient Instructions (Addendum)
We will increase her dose of Lexapro from 10 mg to 20 mg.  I have sent in prescription for the 20 mg pills.  You can take 2 of the 10 mg pills if you have any remaining at home. Managing Anxiety, Adult After being diagnosed with an anxiety disorder, you may be relieved to know why you have felt or behaved a certain way. You may also feel overwhelmed about the treatment ahead and what it will mean for your life. With care and support, you can manage this condition and recover from it. How to manage lifestyle changes Managing stress and anxiety  Stress is your body's reaction to life changes and events, both good and bad. Most stress will last just a few hours, but stress can be ongoing and can lead to more than just stress. Although stress can play a major role in anxiety, it is not the same as anxiety. Stress is usually caused by something external, such as a deadline, test, or competition. Stress normally passes after the triggering event has ended.  Anxiety is caused by something internal, such as imagining a terrible outcome or worrying that something will go wrong that will devastate you. Anxiety often does not go away even after the triggering event is over, and it can become long-term (chronic) worry. It is important to understand the differences between stress and anxiety and to manage your stress effectively so that it does not lead to an anxious response. Talk with your health care provider or a counselor to learn more about reducing anxiety and stress. He or she may suggest tension reduction techniques, such as:  Music therapy. This can include creating or listening to music that you enjoy and that inspires you.  Mindfulness-based meditation. This involves being aware of your normal breaths while not trying to control your breathing. It can be done while sitting or walking.  Centering prayer. This involves focusing on a word, phrase, or sacred image that means something to you and brings you  peace.  Deep breathing. To do this, expand your stomach and inhale slowly through your nose. Hold your breath for 3-5 seconds. Then exhale slowly, letting your stomach muscles relax.  Self-talk. This involves identifying thought patterns that lead to anxiety reactions and changing those patterns.  Muscle relaxation. This involves tensing muscles and then relaxing them. Choose a tension reduction technique that suits your lifestyle and personality. These techniques take time and practice. Set aside 5-15 minutes a day to do them. Therapists can offer counseling and training in these techniques. The training to help with anxiety may be covered by some insurance plans. Other things you can do to manage stress and anxiety include:  Keeping a stress/anxiety diary. This can help you learn what triggers your reaction and then learn ways to manage your response.  Thinking about how you react to certain situations. You may not be able to control everything, but you can control your response.  Making time for activities that help you relax and not feeling guilty about spending your time in this way.  Visual imagery and yoga can help you stay calm and relax.  Medicines Medicines can help ease symptoms. Medicines for anxiety include:  Anti-anxiety drugs.  Antidepressants. Medicines are often used as a primary treatment for anxiety disorder. Medicines will be prescribed by a health care provider. When used together, medicines, psychotherapy, and tension reduction techniques may be the most effective treatment. Relationships Relationships can play a big part in helping you recover. Try  to spend more time connecting with trusted friends and family members. Consider going to couples counseling, taking family education classes, or going to family therapy. Therapy can help you and others better understand your condition. How to recognize changes in your anxiety Everyone responds differently to treatment for  anxiety. Recovery from anxiety happens when symptoms decrease and stop interfering with your daily activities at home or work. This may mean that you will start to:  Have better concentration and focus. Worry will interfere less in your daily thinking.  Sleep better.  Be less irritable.  Have more energy.  Have improved memory. It is important to recognize when your condition is getting worse. Contact your health care provider if your symptoms interfere with home or work and you feel like your condition is not improving. Follow these instructions at home: Activity  Exercise. Most adults should do the following: ? Exercise for at least 150 minutes each week. The exercise should increase your heart rate and make you sweat (moderate-intensity exercise). ? Strengthening exercises at least twice a week.  Get the right amount and quality of sleep. Most adults need 7-9 hours of sleep each night. Lifestyle   Eat a healthy diet that includes plenty of vegetables, fruits, whole grains, low-fat dairy products, and lean protein. Do not eat a lot of foods that are high in solid fats, added sugars, or salt.  Make choices that simplify your life.  Do not use any products that contain nicotine or tobacco, such as cigarettes, e-cigarettes, and chewing tobacco. If you need help quitting, ask your health care provider.  Avoid caffeine, alcohol, and certain over-the-counter cold medicines. These may make you feel worse. Ask your pharmacist which medicines to avoid. General instructions  Take over-the-counter and prescription medicines only as told by your health care provider.  Keep all follow-up visits as told by your health care provider. This is important. Where to find support You can get help and support from these sources:  Self-help groups.  Online and [employer].  A trusted spiritual leader.  Couples counseling.  Family education classes.  Family therapy. Where to  find more information You may find that joining a support group helps you deal with your anxiety. The following sources can help you locate counselors or support groups near you:  Mental Health America: www.mentalhealthamerica.net  Anxiety and Depression Association of [place] (ADAA): ProgramCam.de  [employer] Mental Illness (NAMI): www.nami.org Contact a health care provider if you:  Have a hard time staying focused or finishing daily tasks.  Spend many hours a day feeling worried about everyday life.  Become exhausted by worry.  Start to have headaches, feel tense, or have nausea.  Urinate more than normal.  Have diarrhea. Get help right away if you have:  A racing heart and shortness of breath.  Thoughts of hurting yourself or others. If you ever feel like you may hurt yourself or others, or have thoughts about taking your own life, get help right away. You can go to your nearest emergency department or call:  Your local emergency services (911 in the [place]  A suicide crisis helpline, such as the National Suicide Prevention Lifeline at 386-804-0182. This is open 24 hours a day. Summary  Taking steps to learn and use tension reduction techniques can help calm you and help prevent triggering an anxiety reaction.  When used together, medicines, psychotherapy, and tension reduction techniques may be the most effective treatment.  Family, friends, and partners can play a  big part in helping you recover from an anxiety disorder. This information is not intended to replace advice given to you by your health care provider. Make sure you discuss any questions you have with your health care provider. Document Revised: 01/11/2019 Document Reviewed: 01/11/2019 Elsevier Patient Education  2020 [employer]  Managing Anxiety, Adult After being diagnosed with an anxiety disorder, you may be relieved to know why you have felt or behaved a certain way. You may also feel  overwhelmed about the treatment ahead and what it will mean for your life. With care and support, you can manage this condition and recover from it. How to manage lifestyle changes Managing stress and anxiety  Stress is your body's reaction to life changes and events, both good and bad. Most stress will last just a few hours, but stress can be ongoing and can lead to more than just stress. Although stress can play a major role in anxiety, it is not the same as anxiety. Stress is usually caused by something external, such as a deadline, test, or competition. Stress normally passes after the triggering event has ended.  Anxiety is caused by something internal, such as imagining a terrible outcome or worrying that something will go wrong that will devastate you. Anxiety often does not go away even after the triggering event is over, and it can become long-term (chronic) worry. It is important to understand the differences between stress and anxiety and to manage your stress effectively so that it does not lead to an anxious response. Talk with your health care provider or a counselor to learn more about reducing anxiety and stress. He or she may suggest tension reduction techniques, such as:  Music therapy. This can include creating or listening to music that you enjoy and that inspires you.  Mindfulness-based meditation. This involves being aware of your normal breaths while not trying to control your breathing. It can be done while sitting or walking.  Centering prayer. This involves focusing on a word, phrase, or sacred image that means something to you and brings you peace.  Deep breathing. To do this, expand your stomach and inhale slowly through your nose. Hold your breath for 3-5 seconds. Then exhale slowly, letting your stomach muscles relax.  Self-talk. This involves identifying thought patterns that lead to anxiety reactions and changing those patterns.  Muscle relaxation. This involves  tensing muscles and then relaxing them. Choose a tension reduction technique that suits your lifestyle and personality. These techniques take time and practice. Set aside 5-15 minutes a day to do them. Therapists can offer counseling and training in these techniques. The training to help with anxiety may be covered by some insurance plans. Other things you can do to manage stress and anxiety include:  Keeping a stress/anxiety diary. This can help you learn what triggers your reaction and then learn ways to manage your response.  Thinking about how you react to certain situations. You may not be able to control everything, but you can control your response.  Making time for activities that help you relax and not feeling guilty about spending your time in this way.  Visual imagery and yoga can help you stay calm and relax.  Medicines Medicines can help ease symptoms. Medicines for anxiety include:  Anti-anxiety drugs.  Antidepressants. Medicines are often used as a primary treatment for anxiety disorder. Medicines will be prescribed by a health care provider. When used together, medicines, psychotherapy, and tension reduction techniques may be the most effective  treatment. Relationships Relationships can play a big part in helping you recover. Try to spend more time connecting with trusted friends and family members. Consider going to couples counseling, taking family education classes, or going to family therapy. Therapy can help you and others better understand your condition. How to recognize changes in your anxiety Everyone responds differently to treatment for anxiety. Recovery from anxiety happens when symptoms decrease and stop interfering with your daily activities at home or work. This may mean that you will start to:  Have better concentration and focus. Worry will interfere less in your daily thinking.  Sleep better.  Be less irritable.  Have more energy.  Have improved  memory. It is important to recognize when your condition is getting worse. Contact your health care provider if your symptoms interfere with home or work and you feel like your condition is not improving. Follow these instructions at home: Activity  Exercise. Most adults should do the following: ? Exercise for at least 150 minutes each week. The exercise should increase your heart rate and make you sweat (moderate-intensity exercise). ? Strengthening exercises at least twice a week.  Get the right amount and quality of sleep. Most adults need 7-9 hours of sleep each night. Lifestyle   Eat a healthy diet that includes plenty of vegetables, fruits, whole grains, low-fat dairy products, and lean protein. Do not eat a lot of foods that are high in solid fats, added sugars, or salt.  Make choices that simplify your life.  Do not use any products that contain nicotine or tobacco, such as cigarettes, e-cigarettes, and chewing tobacco. If you need help quitting, ask your health care provider.  Avoid caffeine, alcohol, and certain over-the-counter cold medicines. These may make you feel worse. Ask your pharmacist which medicines to avoid. General instructions  Take over-the-counter and prescription medicines only as told by your health care provider.  Keep all follow-up visits as told by your health care provider. This is important. Where to find support You can get help and support from these sources:  Self-help groups.  Online and [employer].  A trusted spiritual leader.  Couples counseling.  Family education classes.  Family therapy. Where to find more information You may find that joining a support group helps you deal with your anxiety. The following sources can help you locate counselors or support groups near you:  Mental Health America: www.mentalhealthamerica.net  Anxiety and Depression Association of [place] (ADAA): ProgramCam.de  [employer] on Mental  Illness (NAMI): www.nami.org Contact a health care provider if you:  Have a hard time staying focused or finishing daily tasks.  Spend many hours a day feeling worried about everyday life.  Become exhausted by worry.  Start to have headaches, feel tense, or have nausea.  Urinate more than normal.  Have diarrhea. Get help right away if you have:  A racing heart and shortness of breath.  Thoughts of hurting yourself or others. If you ever feel like you may hurt yourself or others, or have thoughts about taking your own life, get help right away. You can go to your nearest emergency department or call:  Your local emergency services (911 in the [place]  A suicide crisis helpline, such as the National Suicide Prevention Lifeline at 979-240-6693. This is open 24 hours a day. Summary  Taking steps to learn and use tension reduction techniques can help calm you and help prevent triggering an anxiety reaction.  When used together, medicines, psychotherapy, and tension reduction techniques may  be the most effective treatment.  Family, friends, and partners can play a big part in helping you recover from an anxiety disorder. This information is not intended to replace advice given to you by your health care provider. Make sure you discuss any questions you have with your health care provider. Document Revised: 01/11/2019 Document Reviewed: 01/11/2019 Elsevier Patient Education  2020 Else[employer] Inc.  Living With Depression Everyone experiences occasional disappointment, sadness, and loss in their lives. When you are feeling down, blue, or sad for at least 2 weeks in a row, it may mean that you have depression. Depression can affect your thoughts and feelings, relationships, daily activities, and physical health. It is caused by changes in the way your brain functions. If you receive a diagnosis of depression, your health care provider will tell you which type of depression you have and what  treatment options are available to you. If you are living with depression, there are ways to help you recover from it and also ways to prevent it from coming back. How to cope with lifestyle changes Coping with stress     Stress is your body's reaction to life changes and events, both good and bad. Stressful situations may include:  Getting married.  The death of a spouse.  Losing a job.  Retiring.  Having a baby. Stress can last just a few hours or it can be ongoing. Stress can play a major role in depression, so it is important to learn both how to cope with stress and how to think about it differently. Talk with your health care provider or a counselor if you would like to learn more about stress reduction. He or she may suggest some stress reduction techniques, such as:  Music therapy. This can include creating music or listening to music. Choose music that you enjoy and that inspires you.  Mindfulness-based meditation. This kind of meditation can be done while sitting or walking. It involves being aware of your normal breaths, rather than trying to control your breathing.  Centering prayer. This is a kind of meditation that involves focusing on a spiritual word or phrase. Choose a word, phrase, or sacred image that is meaningful to you and that brings you peace.  Deep breathing. To do this, expand your stomach and inhale slowly through your nose. Hold your breath for 3-5 seconds, then exhale slowly, allowing your stomach muscles to relax.  Muscle relaxation. This involves intentionally tensing muscles then relaxing them. Choose a stress reduction technique that fits your lifestyle and personality. Stress reduction techniques take time and practice to develop. Set aside 5-15 minutes a day to do them. Therapists can offer training in these techniques. The training may be covered by some insurance plans. Other things you can do to manage stress include:  Keeping a stress diary. This  can help you learn what triggers your stress and ways to control your response.  Understanding what your limits are and saying no to requests or events that lead to a schedule that is too full.  Thinking about how you respond to certain situations. You may not be able to control everything, but you can control how you react.  Adding humor to your life by watching funny films or TV shows.  Making time for activities that help you relax and not feeling guilty about spending your time this way.  Medicines Your health care provider may suggest certain medicines if he or she feels that they will help improve your condition. Avoid  using alcohol and other substances that may prevent your medicines from working properly (may interact). It is also important to:  Talk with your pharmacist or health care provider about all the medicines that you take, their possible side effects, and what medicines are safe to take together.  Make it your goal to take part in all treatment decisions (shared decision-making). This includes giving input on the side effects of medicines. It is best if shared decision-making with your health care provider is part of your total treatment plan. If your health care provider prescribes a medicine, you may not notice the full benefits of it for 4-8 weeks. Most people who are treated for depression need to be on medicine for at least 6-12 months after they feel better. If you are taking medicines as part of your treatment, do not stop taking medicines without first talking to your health care provider. You may need to have the medicine slowly decreased (tapered) over time to decrease the risk of harmful side effects. Relationships Your health care provider may suggest family therapy along with individual therapy and drug therapy. While there may not be family problems that are causing you to feel depressed, it is still important to make sure your family learns as much as they can about  your mental health. Having your family's support can help make your treatment successful. How to recognize changes in your condition Everyone has a different response to treatment for depression. Recovery from major depression happens when you have not had signs of major depression for two months. This may mean that you will start to:  Have more interest in doing activities.  Feel less hopeless than you did 2 months ago.  Have more energy.  Overeat less often, or have better or improving appetite.  Have better concentration. Your health care provider will work with you to decide the next steps in your recovery. It is also important to recognize when your condition is getting worse. Watch for these signs:  Having fatigue or low energy.  Eating too much or too little.  Sleeping too much or too little.  Feeling restless, agitated, or hopeless.  Having trouble concentrating or making decisions.  Having unexplained physical complaints.  Feeling irritable, angry, or aggressive. Get help as soon as you or your family members notice these symptoms coming back. How to get support and help from others How to talk with friends and family members about your condition  Talking to friends and family members about your condition can provide you with one way to get support and guidance. Reach out to trusted friends or family members, explain your symptoms to them, and let them know that you are working with a health care provider to treat your depression. Financial resources Not all insurance plans cover mental health care, so it is important to check with your insurance carrier. If paying for co-pays or counseling services is a problem, search for a local or county mental health care center. They may be able to offer public mental health care services at low or no cost when you are not able to see a private health care provider. If you are taking medicine for depression, you may be able to get  the generic form, which may be less expensive. Some makers of prescription medicines also offer help to patients who cannot afford the medicines they need. Follow these instructions at home:   Get the right amount and quality of sleep.  Cut down on using caffeine, tobacco,  alcohol, and other potentially harmful substances.  Try to exercise, such as walking or lifting small weights.  Take over-the-counter and prescription medicines only as told by your health care provider.  Eat a healthy diet that includes plenty of vegetables, fruits, whole grains, low-fat dairy products, and lean protein. Do not eat a lot of foods that are high in solid fats, added sugars, or salt.  Keep all follow-up visits as told by your health care provider. This is important. Contact a health care provider if:  You stop taking your antidepressant medicines, and you have any of these symptoms: ? Nausea. ? Headache. ? Feeling lightheaded. ? Chills and body aches. ? Not being able to sleep (insomnia).  You or your friends and family think your depression is getting worse. Get help right away if:  You have thoughts of hurting yourself or others. If you ever feel like you may hurt yourself or others, or have thoughts about taking your own life, get help right away. You can go to your nearest emergency department or call:  Your local emergency services (911 in the [place]  A suicide crisis helpline, such as the [facility] at 902-022-8157. This is open 24-hours a day. Summary  If you are living with depression, there are ways to help you recover from it and also ways to prevent it from coming back.  Work with your health care team to create a management plan that includes counseling, stress management techniques, and healthy lifestyle habits. This information is not intended to replace advice given to you by your health care provider. Make sure you discuss any questions you have with  your health care provider. Document Revised: 12/03/2018 Document Reviewed: 07/14/2016 Elsevier Patient Education  [place].

## 2019-10-03 DIAGNOSIS — D171 Benign lipomatous neoplasm of skin and subcutaneous tissue of trunk: Secondary | ICD-10-CM | POA: Diagnosis not present

## 2019-10-16 ENCOUNTER — Other Ambulatory Visit: Payer: Self-pay | Admitting: Family Medicine

## 2019-10-16 DIAGNOSIS — F32A Depression, unspecified: Secondary | ICD-10-CM

## 2019-10-16 DIAGNOSIS — F329 Major depressive disorder, single episode, unspecified: Secondary | ICD-10-CM

## 2019-10-17 ENCOUNTER — Other Ambulatory Visit: Payer: Self-pay

## 2019-11-29 DIAGNOSIS — Z23 Encounter for immunization: Secondary | ICD-10-CM | POA: Diagnosis not present

## 2019-12-27 DIAGNOSIS — Z23 Encounter for immunization: Secondary | ICD-10-CM | POA: Diagnosis not present

## 2020-01-12 ENCOUNTER — Other Ambulatory Visit: Payer: Self-pay | Admitting: Family Medicine

## 2020-01-12 DIAGNOSIS — F32A Depression, unspecified: Secondary | ICD-10-CM

## 2020-01-12 DIAGNOSIS — F419 Anxiety disorder, unspecified: Secondary | ICD-10-CM

## 2020-04-06 ENCOUNTER — Other Ambulatory Visit: Payer: Self-pay | Admitting: Family Medicine

## 2020-04-06 DIAGNOSIS — F329 Major depressive disorder, single episode, unspecified: Secondary | ICD-10-CM

## 2020-04-06 DIAGNOSIS — F32A Depression, unspecified: Secondary | ICD-10-CM

## 2020-04-06 NOTE — Telephone Encounter (Signed)
Left pt a messsage to call the office and schedule a MyChart visit for renewal of his Lexapro refill

## 2020-04-09 NOTE — Telephone Encounter (Signed)
Left pt a detailed message to call and schedule an MyChart video visit /office visit for his f/u med refill for Lexapro

## 2020-04-12 ENCOUNTER — Other Ambulatory Visit: Payer: Self-pay

## 2020-04-12 ENCOUNTER — Encounter: Payer: Self-pay | Admitting: Family Medicine

## 2020-04-12 ENCOUNTER — Ambulatory Visit (INDEPENDENT_AMBULATORY_CARE_PROVIDER_SITE_OTHER): Payer: BC Managed Care – PPO | Admitting: Family Medicine

## 2020-04-12 VITALS — BP 108/78 | HR 84 | Temp 98.9°F | Wt 220.6 lb

## 2020-04-12 DIAGNOSIS — F419 Anxiety disorder, unspecified: Secondary | ICD-10-CM | POA: Diagnosis not present

## 2020-04-12 DIAGNOSIS — F329 Major depressive disorder, single episode, unspecified: Secondary | ICD-10-CM

## 2020-04-12 DIAGNOSIS — R635 Abnormal weight gain: Secondary | ICD-10-CM

## 2020-04-12 DIAGNOSIS — F32A Depression, unspecified: Secondary | ICD-10-CM

## 2020-04-12 MED ORDER — ESCITALOPRAM OXALATE 20 MG PO TABS: 20.0000 mg | ORAL_TABLET | Freq: Every day | ORAL | 1 refills | Status: DC

## 2020-04-12 NOTE — Telephone Encounter (Signed)
Pt is scheduled for med refill today 08/19 2021 at 2 pm

## 2020-04-12 NOTE — Patient Instructions (Addendum)
Preventing Unhealthy Weight Gain, Adult Staying at a healthy weight is important to your overall health. When fat builds up in your body, you may become overweight or obese. Being overweight or obese increases your risk of developing certain health problems, such as heart disease, diabetes, sleeping problems, joint problems, and some types of cancer. Unhealthy weight gain is often the result of making unhealthy food choices or not getting enough exercise. You can make changes to your lifestyle to prevent obesity and stay as healthy as possible. What nutrition changes can be made?   Eat only as much as your body needs. To do this: ? Pay attention to signs that you are hungry or full. Stop eating as soon as you feel full. ? If you feel hungry, try drinking water first before eating. Drink enough water so your urine is clear or pale yellow. ? Eat smaller portions. Pay attention to portion sizes when eating out. ? Look at serving sizes on food labels. Most foods contain more than one serving per container. ? Eat the recommended number of calories for your gender and activity level. For most active people, a daily total of 2,000 calories is appropriate. If you are trying to lose weight or are not very active, you may need to eat fewer calories. Talk with your health care provider or a diet and nutrition specialist (dietitian) about how many calories you need each day.  Choose healthy foods, such as: ? Fruits and vegetables. At each meal, try to fill at least half of your plate with fruits and vegetables. ? Whole grains, such as whole-wheat bread, brown rice, and quinoa. ? Lean meats, such as chicken or fish. ? Other healthy proteins, such as beans, eggs, or tofu. ? Healthy fats, such as nuts, seeds, fatty fish, and olive oil. ? Low-fat or fat-free dairy products.  Check food labels, and avoid food and drinks that: ? Are high in calories. ? Have added sugar. ? Are high in sodium. ? Have saturated  fats or trans fats.  Cook foods in healthier ways, such as by baking, broiling, or grilling.  Make a meal plan for the week, and shop with a grocery list to help you stay on track with your purchases. Try to avoid going to the grocery store when you are hungry.  When grocery shopping, try to shop around the outside of the store first, where the fresh foods are. Doing this helps you to avoid prepackaged foods, which can be high in sugar, salt (sodium), and fat. What lifestyle changes can be made?   Exercise for 30 or more minutes on 5 or more days each week. Exercising may include brisk walking, yard work, biking, running, swimming, and team sports like basketball and soccer. Ask your health care provider which exercises are safe for you.  Do muscle-strengthening activities, such as lifting weights or using resistance bands, on 2 or more days a week.  Do not use any products that contain nicotine or tobacco, such as cigarettes and e-cigarettes. If you need help quitting, ask your health care provider.  Limit alcohol intake to no more than 1 drink a day for nonpregnant women and 2 drinks a day for men. One drink equals 12 oz of beer, 5 oz of wine, or 1 oz of hard liquor.  Try to get 7-9 hours of sleep each night. What other changes can be made?  Keep a food and activity journal to keep track of: ? What you ate and how many calories   you had. Remember to count the calories in sauces, dressings, and side dishes. ? Whether you were active, and what exercises you did. ? Your calorie, weight, and activity goals.  Check your weight regularly. Track any changes. If you notice you have gained weight, make changes to your diet or activity routine.  Avoid taking weight-loss medicines or supplements. Talk to your health care provider before starting any new medicine or supplement.  Talk to your health care provider before trying any new diet or exercise plan. Why are these changes  important? Eating healthy, staying active, and having healthy habits can help you to prevent obesity. Those changes also:  Help you manage stress and emotions.  Help you connect with friends and family.  Improve your self-esteem.  Improve your sleep.  Prevent long-term health problems. What can happen if changes are not made? Being obese or overweight can cause you to develop joint or bone problems, which can make it hard for you to stay active or do activities you enjoy. Being obese or overweight also puts stress on your heart and lungs and can lead to health problems like diabetes, heart disease, and some cancers. Where to find more information Talk with your health care provider or a dietitian about healthy eating and healthy lifestyle choices. You may also find information from:  [place] Department of Agriculture, MyPlate: https://ball-collins.biz/  American Heart Association: www.heart.org  Centers for Disease Control and Prevention: FootballExhibition.com.br Summary  Staying at a healthy weight is important to your overall health. It helps you to prevent certain diseases and health problems, such as heart disease, diabetes, joint problems, sleep disorders, and some types of cancer.  Being obese or overweight can cause you to develop joint or bone problems, which can make it hard for you to stay active or do activities you enjoy.  You can prevent unhealthy weight gain by eating a healthy diet, exercising regularly, not smoking, limiting alcohol, and getting enough sleep.  Talk with your health care provider or a dietitian for guidance about healthy eating and healthy lifestyle choices. This information is not intended to replace advice given to you by your health care provider. Make sure you discuss any questions you have with your health care provider. Document Revised: 08/14/2017 Document Reviewed: 09/17/2016 Elsevier Patient Education  2020 [employer].  Calorie Counting for [employer]  Loss Calories are units of energy. Your body needs a certain amount of calories from food to keep you going throughout the day. When you eat more calories than your body needs, your body stores the extra calories as fat. When you eat fewer calories than your body needs, your body burns fat to get the energy it needs. Calorie counting means keeping track of how many calories you eat and drink each day. Calorie counting can be helpful if you need to lose weight. If you make sure to eat fewer calories than your body needs, you should lose weight. Ask your health care provider what a healthy weight is for you. For calorie counting to work, you will need to eat the right number of calories in a day in order to lose a healthy amount of weight per week. A dietitian can help you determine how many calories you need in a day and will give you suggestions on how to reach your calorie goal.  A healthy amount of weight to lose per week is usually 1-2 lb (0.5-0.9 kg). This usually means that your daily calorie intake should be reduced by 500-750 calories.  Eating 1,200 - 1,500 calories per day can help most women lose weight.  Eating 1,500 - 1,800 calories per day can help most men lose weight. What is my plan? My goal is to have __________ calories per day. If I have this many calories per day, I should lose around __________ pounds per week. What do I need to know about calorie counting? In order to meet your daily calorie goal, you will need to:  Find out how many calories are in each food you would like to eat. Try to do this before you eat.  Decide how much of the food you plan to eat.  Write down what you ate and how many calories it had. Doing this is called keeping a food log. To successfully lose weight, it is important to balance calorie counting with a healthy lifestyle that includes regular activity. Aim for 150 minutes of moderate exercise (such as walking) or 75 minutes of vigorous exercise  (such as running) each week. Where do I find calorie information?  The number of calories in a food can be found on a Nutrition Facts label. If a food does not have a Nutrition Facts label, try to look up the calories online or ask your dietitian for help. Remember that calories are listed per serving. If you choose to have more than one serving of a food, you will have to multiply the calories per serving by the amount of servings you plan to eat. For example, the label on a package of bread might say that a serving size is 1 slice and that there are 90 calories in a serving. If you eat 1 slice, you will have eaten 90 calories. If you eat 2 slices, you will have eaten 180 calories. How do I keep a food log? Immediately after each meal, record the following information in your food log:  What you ate. Don't forget to include toppings, sauces, and other extras on the food.  How much you ate. This can be measured in cups, ounces, or number of items.  How many calories each food and drink had.  The total number of calories in the meal. Keep your food log near you, such as in a small notebook in your pocket, or use a mobile app or website. Some programs will calculate calories for you and show you how many calories you have left for the day to meet your goal. What are some calorie counting tips?   Use your calories on foods and drinks that will fill you up and not leave you hungry: ? Some examples of foods that fill you up are nuts and nut butters, vegetables, lean proteins, and high-fiber foods like whole grains. High-fiber foods are foods with more than 5 g fiber per serving. ? Drinks such as sodas, specialty coffee drinks, alcohol, and juices have a lot of calories, yet do not fill you up.  Eat nutritious foods and avoid empty calories. Empty calories are calories you get from foods or beverages that do not have many vitamins or protein, such as candy, sweets, and soda. It is better to have a  nutritious high-calorie food (such as an avocado) than a food with few nutrients (such as a bag of chips).  Know how many calories are in the foods you eat most often. This will help you calculate calorie counts faster.  Pay attention to calories in drinks. Low-calorie drinks include water and unsweetened drinks.  Pay attention to nutrition labels for "low fat"  or "fat free" foods. These foods sometimes have the same amount of calories or more calories than the full fat versions. They also often have added sugar, starch, or salt, to make up for flavor that was removed with the fat.  Find a way of tracking calories that works for you. Get creative. Try different apps or programs if writing down calories does not work for you. What are some portion control tips?  Know how many calories are in a serving. This will help you know how many servings of a certain food you can have.  Use a measuring cup to measure serving sizes. You could also try weighing out portions on a kitchen scale. With time, you will be able to estimate serving sizes for some foods.  Take some time to put servings of different foods on your favorite plates, bowls, and cups so you know what a serving looks like.  Try not to eat straight from a bag or box. Doing this can lead to overeating. Put the amount you would like to eat in a cup or on a plate to make sure you are eating the right portion.  Use smaller plates, glasses, and bowls to prevent overeating.  Try not to multitask (for example, watch TV or use your computer) while eating. If it is time to eat, sit down at a table and enjoy your food. This will help you to know when you are full. It will also help you to be aware of what you are eating and how much you are eating. What are tips for following this plan? Reading food labels  Check the calorie count compared to the serving size. The serving size may be smaller than what you are used to eating.  Check the source of  the calories. Make sure the food you are eating is high in vitamins and protein and low in saturated and trans fats. Shopping  Read nutrition labels while you shop. This will help you make healthy decisions before you decide to purchase your food.  Make a grocery list and stick to it. Cooking  Try to cook your favorite foods in a healthier way. For example, try baking instead of frying.  Use low-fat dairy products. Meal planning  Use more fruits and vegetables. Half of your plate should be fruits and vegetables.  Include lean proteins like poultry and fish. How do I count calories when eating out?  Ask for smaller portion sizes.  Consider sharing an entree and sides instead of getting your own entree.  If you get your own entree, eat only half. Ask for a box at the beginning of your meal and put the rest of your entree in it so you are not tempted to eat it.  If calories are listed on the menu, choose the lower calorie options.  Choose dishes that include vegetables, fruits, whole grains, low-fat dairy products, and lean protein.  Choose items that are boiled, broiled, grilled, or steamed. Stay away from items that are buttered, battered, fried, or served with cream sauce. Items labeled "crispy" are usually fried, unless stated otherwise.  Choose water, low-fat milk, unsweetened iced tea, or other drinks without added sugar. If you want an alcoholic beverage, choose a lower calorie option such as a glass of wine or light beer.  Ask for dressings, sauces, and syrups on the side. These are usually high in calories, so you should limit the amount you eat.  If you want a salad, choose a garden salad  and ask for grilled meats. Avoid extra toppings like bacon, cheese, or fried items. Ask for the dressing on the side, or ask for olive oil and vinegar or lemon to use as dressing.  Estimate how many servings of a food you are given. For example, a serving of cooked rice is  cup or about  the size of half a baseball. Knowing serving sizes will help you be aware of how much food you are eating at restaurants. The list below tells you how big or small some common portion sizes are based on everyday objects: ? 1 oz--4 stacked dice. ? 3 oz--1 deck of cards. ? 1 tsp--1 die. ? 1 Tbsp-- a ping-pong ball. ? 2 Tbsp--1 ping-pong ball. ?  cup-- baseball. ? 1 cup--1 baseball. Summary  Calorie counting means keeping track of how many calories you eat and drink each day. If you eat fewer calories than your body needs, you should lose weight.  A healthy amount of weight to lose per week is usually 1-2 lb (0.5-0.9 kg). This usually means reducing your daily calorie intake by 500-750 calories.  The number of calories in a food can be found on a Nutrition Facts label. If a food does not have a Nutrition Facts label, try to look up the calories online or ask your dietitian for help.  Use your calories on foods and drinks that will fill you up, and not on foods and drinks that will leave you hungry.  Use smaller plates, glasses, and bowls to prevent overeating. This information is not intended to replace advice given to you by your health care provider. Make sure you discuss any questions you have with your health care provider. Document Revised: 04/30/2018 Document Reviewed: 07/11/2016 Elsevier Patient Education  2020 Elsevier Inc.  Living With Depression Everyone experiences occasional disappointment, sadness, and loss in their lives. When you are feeling down, blue, or sad for at least 2 weeks in a row, it may mean that you have depression. Depression can affect your thoughts and feelings, relationships, daily activities, and physical health. It is caused by changes in the way your brain functions. If you receive a diagnosis of depression, your health care provider will tell you which type of depression you have and what treatment options are available to you. If you are living with  depression, there are ways to help you recover from it and also ways to prevent it from coming back. How to cope with lifestyle changes Coping with stress     Stress is your body's reaction to life changes and events, both good and bad. Stressful situations may include:  Getting married.  The death of a spouse.  Losing a job.  Retiring.  Having a baby. Stress can last just a few hours or it can be ongoing. Stress can play a major role in depression, so it is important to learn both how to cope with stress and how to think about it differently. Talk with your health care provider or a counselor if you would like to learn more about stress reduction. He or she may suggest some stress reduction techniques, such as:  Music therapy. This can include creating music or listening to music. Choose music that you enjoy and that inspires you.  Mindfulness-based meditation. This kind of meditation can be done while sitting or walking. It involves being aware of your normal breaths, rather than trying to control your breathing.  Centering prayer. This is a kind of meditation that involves focusing  on a spiritual word or phrase. Choose a word, phrase, or sacred image that is meaningful to you and that brings you peace.  Deep breathing. To do this, expand your stomach and inhale slowly through your nose. Hold your breath for 3-5 seconds, then exhale slowly, allowing your stomach muscles to relax.  Muscle relaxation. This involves intentionally tensing muscles then relaxing them. Choose a stress reduction technique that fits your lifestyle and personality. Stress reduction techniques take time and practice to develop. Set aside 5-15 minutes a day to do them. Therapists can offer training in these techniques. The training may be covered by some insurance plans. Other things you can do to manage stress include:  Keeping a stress diary. This can help you learn what triggers your stress and ways to  control your response.  Understanding what your limits are and saying no to requests or events that lead to a schedule that is too full.  Thinking about how you respond to certain situations. You may not be able to control everything, but you can control how you react.  Adding humor to your life by watching funny films or TV shows.  Making time for activities that help you relax and not feeling guilty about spending your time this way.  Medicines Your health care provider may suggest certain medicines if he or she feels that they will help improve your condition. Avoid using alcohol and other substances that may prevent your medicines from working properly (may interact). It is also important to:  Talk with your pharmacist or health care provider about all the medicines that you take, their possible side effects, and what medicines are safe to take together.  Make it your goal to take part in all treatment decisions (shared decision-making). This includes giving input on the side effects of medicines. It is best if shared decision-making with your health care provider is part of your total treatment plan. If your health care provider prescribes a medicine, you may not notice the full benefits of it for 4-8 weeks. Most people who are treated for depression need to be on medicine for at least 6-12 months after they feel better. If you are taking medicines as part of your treatment, do not stop taking medicines without first talking to your health care provider. You may need to have the medicine slowly decreased (tapered) over time to decrease the risk of harmful side effects. Relationships Your health care provider may suggest family therapy along with individual therapy and drug therapy. While there may not be family problems that are causing you to feel depressed, it is still important to make sure your family learns as much as they can about your mental health. Having your family's support can  help make your treatment successful. How to recognize changes in your condition Everyone has a different response to treatment for depression. Recovery from major depression happens when you have not had signs of major depression for two months. This may mean that you will start to:  Have more interest in doing activities.  Feel less hopeless than you did 2 months ago.  Have more energy.  Overeat less often, or have better or improving appetite.  Have better concentration. Your health care provider will work with you to decide the next steps in your recovery. It is also important to recognize when your condition is getting worse. Watch for these signs:  Having fatigue or low energy.  Eating too much or too little.  Sleeping too much or  too little.  Feeling restless, agitated, or hopeless.  Having trouble concentrating or making decisions.  Having unexplained physical complaints.  Feeling irritable, angry, or aggressive. Get help as soon as you or your family members notice these symptoms coming back. How to get support and help from others How to talk with friends and family members about your condition  Talking to friends and family members about your condition can provide you with one way to get support and guidance. Reach out to trusted friends or family members, explain your symptoms to them, and let them know that you are working with a health care provider to treat your depression. Financial resources Not all insurance plans cover mental health care, so it is important to check with your insurance carrier. If paying for co-pays or counseling services is a problem, search for a local or county mental health care center. They may be able to offer public mental health care services at low or no cost when you are not able to see a private health care provider. If you are taking medicine for depression, you may be able to get the generic form, which may be less expensive. Some  makers of prescription medicines also offer help to patients who cannot afford the medicines they need. Follow these instructions at home:   Get the right amount and quality of sleep.  Cut down on using caffeine, tobacco, alcohol, and other potentially harmful substances.  Try to exercise, such as walking or lifting small weights.  Take over-the-counter and prescription medicines only as told by your health care provider.  Eat a healthy diet that includes plenty of vegetables, fruits, whole grains, low-fat dairy products, and lean protein. Do not eat a lot of foods that are high in solid fats, added sugars, or salt.  Keep all follow-up visits as told by your health care provider. This is important. Contact a health care provider if:  You stop taking your antidepressant medicines, and you have any of these symptoms: ? Nausea. ? Headache. ? Feeling lightheaded. ? Chills and body aches. ? Not being able to sleep (insomnia).  You or your friends and family think your depression is getting worse. Get help right away if:  You have thoughts of hurting yourself or others. If you ever feel like you may hurt yourself or others, or have thoughts about taking your own life, get help right away. You can go to your nearest emergency department or call:  Your local emergency services (911 in the [place]  A suicide crisis helpline, such as the National Suicide Prevention Lifeline at 320-489-5757. This is open 24-hours a day. Summary  If you are living with depression, there are ways to help you recover from it and also ways to prevent it from coming back.  Work with your health care team to create a management plan that includes counseling, stress management techniques, and healthy lifestyle habits. This information is not intended to replace advice given to you by your health care provider. Make sure you discuss any questions you have with your health care provider. Document Revised:  12/03/2018 Document Reviewed: 07/14/2016 Elsevier Patient Education  2020 [employer] Inc.  Managing Anxiety, Adult After being diagnosed with an anxiety disorder, you may be relieved to know why you have felt or behaved a certain way. You may also feel overwhelmed about the treatment ahead and what it will mean for your life. With care and support, you can manage this condition and recover from it. How to manage  lifestyle changes Managing stress and anxiety  Stress is your body's reaction to life changes and events, both good and bad. Most stress will last just a few hours, but stress can be ongoing and can lead to more than just stress. Although stress can play a major role in anxiety, it is not the same as anxiety. Stress is usually caused by something external, such as a deadline, test, or competition. Stress normally passes after the triggering event has ended.  Anxiety is caused by something internal, such as imagining a terrible outcome or worrying that something will go wrong that will devastate you. Anxiety often does not go away even after the triggering event is over, and it can become long-term (chronic) worry. It is important to understand the differences between stress and anxiety and to manage your stress effectively so that it does not lead to an anxious response. Talk with your health care provider or a counselor to learn more about reducing anxiety and stress. He or she may suggest tension reduction techniques, such as:  Music therapy. This can include creating or listening to music that you enjoy and that inspires you.  Mindfulness-based meditation. This involves being aware of your normal breaths while not trying to control your breathing. It can be done while sitting or walking.  Centering prayer. This involves focusing on a word, phrase, or sacred image that means something to you and brings you peace.  Deep breathing. To do this, expand your stomach and inhale slowly through  your nose. Hold your breath for 3-5 seconds. Then exhale slowly, letting your stomach muscles relax.  Self-talk. This involves identifying thought patterns that lead to anxiety reactions and changing those patterns.  Muscle relaxation. This involves tensing muscles and then relaxing them. Choose a tension reduction technique that suits your lifestyle and personality. These techniques take time and practice. Set aside 5-15 minutes a day to do them. Therapists can offer counseling and training in these techniques. The training to help with anxiety may be covered by some insurance plans. Other things you can do to manage stress and anxiety include:  Keeping a stress/anxiety diary. This can help you learn what triggers your reaction and then learn ways to manage your response.  Thinking about how you react to certain situations. You may not be able to control everything, but you can control your response.  Making time for activities that help you relax and not feeling guilty about spending your time in this way.  Visual imagery and yoga can help you stay calm and relax.  Medicines Medicines can help ease symptoms. Medicines for anxiety include:  Anti-anxiety drugs.  Antidepressants. Medicines are often used as a primary treatment for anxiety disorder. Medicines will be prescribed by a health care provider. When used together, medicines, psychotherapy, and tension reduction techniques may be the most effective treatment. Relationships Relationships can play a big part in helping you recover. Try to spend more time connecting with trusted friends and family members. Consider going to couples counseling, taking family education classes, or going to family therapy. Therapy can help you and others better understand your condition. How to recognize changes in your anxiety Everyone responds differently to treatment for anxiety. Recovery from anxiety happens when symptoms decrease and stop interfering  with your daily activities at home or work. This may mean that you will start to:  Have better concentration and focus. Worry will interfere less in your daily thinking.  Sleep better.  Be less irritable.  Have  more energy.  Have improved memory. It is important to recognize when your condition is getting worse. Contact your health care provider if your symptoms interfere with home or work and you feel like your condition is not improving. Follow these instructions at home: Activity  Exercise. Most adults should do the following: ? Exercise for at least 150 minutes each week. The exercise should increase your heart rate and make you sweat (moderate-intensity exercise). ? Strengthening exercises at least twice a week.  Get the right amount and quality of sleep. Most adults need 7-9 hours of sleep each night. Lifestyle   Eat a healthy diet that includes plenty of vegetables, fruits, whole grains, low-fat dairy products, and lean protein. Do not eat a lot of foods that are high in solid fats, added sugars, or salt.  Make choices that simplify your life.  Do not use any products that contain nicotine or tobacco, such as cigarettes, e-cigarettes, and chewing tobacco. If you need help quitting, ask your health care provider.  Avoid caffeine, alcohol, and certain over-the-counter cold medicines. These may make you feel worse. Ask your pharmacist which medicines to avoid. General instructions  Take over-the-counter and prescription medicines only as told by your health care provider.  Keep all follow-up visits as told by your health care provider. This is important. Where to find support You can get help and support from these sources:  Self-help groups.  Online and [employer].  A trusted spiritual leader.  Couples counseling.  Family education classes.  Family therapy. Where to find more information You may find that joining a support group helps you deal with  your anxiety. The following sources can help you locate counselors or support groups near you:  Mental Health America: www.mentalhealthamerica.net  Anxiety and Depression Association of [place] (ADAA): ProgramCam.de  [employer] on Mental Illness (NAMI): www.nami.org Contact a health care provider if you:  Have a hard time staying focused or finishing daily tasks.  Spend many hours a day feeling worried about everyday life.  Become exhausted by worry.  Start to have headaches, feel tense, or have nausea.  Urinate more than normal.  Have diarrhea. Get help right away if you have:  A racing heart and shortness of breath.  Thoughts of hurting yourself or others. If you ever feel like you may hurt yourself or others, or have thoughts about taking your own life, get help right away. You can go to your nearest emergency department or call:  Your local emergency services (911 in the [place]  A suicide crisis helpline, such as the National Suicide Prevention Lifeline at 865-190-9430. This is open 24 hours a day. Summary  Taking steps to learn and use tension reduction techniques can help calm you and help prevent triggering an anxiety reaction.  When used together, medicines, psychotherapy, and tension reduction techniques may be the most effective treatment.  Family, friends, and partners can play a big part in helping you recover from an anxiety disorder. This information is not intended to replace advice given to you by your health care provider. Make sure you discuss any questions you have with your health care provider. Document Revised: 01/11/2019 Document Reviewed: 01/11/2019 Elsevier Patient Education  2020 Elsevier Inc.  Corns and Calluses Corns are small areas of thickened skin that occur on the top, sides, or tip of a toe. They contain a cone-shaped core with a point that can press on a nerve below. This causes pain.  Calluses are areas of thickened skin  that can  occur anywhere on the body, including the hands, fingers, palms, soles of the feet, and heels. Calluses are usually larger than corns. What are the causes? Corns and calluses are caused by rubbing (friction) or pressure, such as from shoes that are too tight or do not fit properly. What increases the risk? Corns are more likely to develop in people who have misshapen toes (toe deformities), such as hammer toes. Calluses can occur with friction to any area of the skin. They are more likely to develop in people who:  Work with their hands.  Wear shoes that fit poorly, are too tight, or are high-heeled.  Have toe deformities. What are the signs or symptoms? Symptoms of a corn or callus include:  A hard growth on the skin.  Pain or tenderness under the skin.  Redness and swelling.  Increased discomfort while wearing tight-fitting shoes, if your feet are affected. If a corn or callus becomes infected, symptoms may include:  Redness and swelling that gets worse.  Pain.  Fluid, blood, or pus draining from the corn or callus. How is this diagnosed? Corns and calluses may be diagnosed based on your symptoms, your medical history, and a physical exam. How is this treated? Treatment for corns and calluses may include:  Removing the cause of the friction or pressure. This may involve: ? Changing your shoes. ? Wearing shoe inserts (orthotics) or other protective layers in your shoes, such as a corn pad. ? Wearing gloves.  Applying medicine to the skin (topical medicine) to help soften skin in the hardened, thickened areas.  Removing layers of dead skin with a file to reduce the size of the corn or callus.  Removing the corn or callus with a scalpel or laser.  Taking antibiotic medicines, if your corn or callus is infected.  Having surgery, if a toe deformity is the cause. Follow these instructions at home:   Take over-the-counter and prescription medicines only as told by your  health care provider.  If you were prescribed an antibiotic, take it as told by your health care provider. Do not stop taking it even if your condition starts to improve.  Wear shoes that fit well. Avoid wearing high-heeled shoes and shoes that are too tight or too loose.  Wear any padding, protective layers, gloves, or orthotics as told by your health care provider.  Soak your hands or feet and then use a file or pumice stone to soften your corn or callus. Do this as told by your health care provider.  Check your corn or callus every day for symptoms of infection. Contact a health care provider if you:  Notice that your symptoms do not improve with treatment.  Have redness or swelling that gets worse.  Notice that your corn or callus becomes painful.  Have fluid, blood, or pus coming from your corn or callus.  Have new symptoms. Summary  Corns are small areas of thickened skin that occur on the top, sides, or tip of a toe.  Calluses are areas of thickened skin that can occur anywhere on the body, including the hands, fingers, palms, and soles of the feet. Calluses are usually larger than corns.  Corns and calluses are caused by rubbing (friction) or pressure, such as from shoes that are too tight or do not fit properly.  Treatment may include wearing any padding, protective layers, gloves, or orthotics as told by your health care provider. This information is not intended to replace advice given  to you by your health care provider. Make sure you discuss any questions you have with your health care provider. Document Revised: 12/01/2018 Document Reviewed: 06/24/2017 Elsevier Patient Education  2020 [employer].

## 2020-04-12 NOTE — Progress Notes (Signed)
Subjective:    Patient ID: Andrew Hensley, male    DOB: 11-14-1995, 24 y.o.   MRN: [id number]  No chief complaint on file.   HPI Patient was seen today for f/u on anxiety and depression.  Pt taking lexapro 20 mg for the last few months.  Notes improvement in depression and anxiety.  Pt states sleep is ok.  May have difficulty falling asleep some nights or wake up in the middle of the night.  Pt goes to bed around midnight and gets up around 11 am as he is on a flex schedule at work.  Pt interested in losing wt as he has gained 25 lbs in 3 yrs.  Pt would like to weight 180 lbs.  Was working out.  Pt notes improvement in a few warts that were on his fingers.  Pt states he went to Dermatology and was advised a few areas were calluses.  Pt endorses picking at the areas on his fingers regularly.  Past Medical History:  Diagnosis Date  . Migraine     No Known Allergies  ROS General: Denies fever, chills, night sweats, changes in weight, changes in appetite  +weight gain HEENT: Denies headaches, ear pain, changes in vision, rhinorrhea, sore throat CV: Denies CP, palpitations, SOB, orthopnea Pulm: Denies SOB, cough, wheezing GI: Denies abdominal pain, nausea, vomiting, diarrhea, constipation GU: Denies dysuria, hematuria, frequency, vaginal discharge Msk: Denies muscle cramps, joint pains Neuro: Denies weakness, numbness, tingling Skin: Denies rashes, bruising  +skin lesions on fingers Psych: Denies hallucinations  +anxiety and depression      Objective:    Blood pressure 108/78, pulse 84, temperature 98.9 F (37.2 C), temperature source Oral, weight 220 lb 9.6 oz (100.1 kg), SpO2 97 %.   Gen. Pleasant, well-nourished, in no distress, normal affect   HEENT: [place]/AT, face symmetric, conjunctiva clear, no scleral icterus, PERRLA, EOMI, nares patent without drainage Lungs: no accessory muscle use Cardiovascular: RRR, no m/r/g, no peripheral edema Musculoskeletal: No deformities, no  cyanosis or clubbing, normal tone Neuro:  A&Ox3, CN II-XII intact, normal gait Skin:  Warm, no lesions/ rash   Wt Readings from Last 3 Encounters:  04/12/20 220 lb 9.6 oz (100.1 kg)  09/22/19 218 lb (98.9 kg)  08/27/18 218 lb (98.9 kg)    Lab Results  Component Value Date   WBC 5.7 07/07/2018   HGB 16.6 07/07/2018   HCT 48.7 07/07/2018   PLT 242.0 07/07/2018   GLUCOSE 94 07/07/2018   NA 141 07/07/2018   K 4.4 07/07/2018   CL 103 07/07/2018   CREATININE 1.00 07/07/2018   BUN 15 07/07/2018   CO2 31 07/07/2018   TSH 2.33 07/07/2018    Assessment/Plan:  Anxiety and depression  -stable -PHQ 9 score 9  Was 15 on 09/22/19 -GAD 7 score 5  Was 12 on 09/22/19 -consider counseling -continue lexapro 20 mg - Plan: escitalopram (LEXAPRO) 20 MG tablet  Weight gain -discussed lifestyle modifications including portion control and exercise. -given info  F/u prn in the next few months.  Abbe Amsterdam, MD

## 2020-10-21 ENCOUNTER — Other Ambulatory Visit: Payer: Self-pay | Admitting: Family Medicine

## 2020-10-21 DIAGNOSIS — F419 Anxiety disorder, unspecified: Secondary | ICD-10-CM

## 2020-10-21 DIAGNOSIS — F32A Depression, unspecified: Secondary | ICD-10-CM

## 2020-10-22 NOTE — Telephone Encounter (Signed)
Left a voice message for pt to call the office for appointment on his Lexapro refill

## 2022-10-15 ENCOUNTER — Ambulatory Visit (INDEPENDENT_AMBULATORY_CARE_PROVIDER_SITE_OTHER): Payer: Managed Care, Other (non HMO)

## 2022-10-15 ENCOUNTER — Encounter: Payer: Self-pay | Admitting: Pulmonary Disease

## 2022-10-15 ENCOUNTER — Ambulatory Visit (INDEPENDENT_AMBULATORY_CARE_PROVIDER_SITE_OTHER): Payer: Managed Care, Other (non HMO) | Admitting: Pulmonary Disease

## 2022-10-15 VITALS — BP 134/80 | HR 59 | Temp 98.0°F | Ht 73.0 in | Wt 207.0 lb

## 2022-10-15 DIAGNOSIS — R062 Wheezing: Secondary | ICD-10-CM

## 2022-10-15 DIAGNOSIS — F1290 Cannabis use, unspecified, uncomplicated: Secondary | ICD-10-CM | POA: Diagnosis not present

## 2022-10-15 DIAGNOSIS — R0602 Shortness of breath: Secondary | ICD-10-CM

## 2022-10-15 DIAGNOSIS — J320 Chronic maxillary sinusitis: Secondary | ICD-10-CM

## 2022-10-15 MED ORDER — ALBUTEROL SULFATE HFA 108 (90 BASE) MCG/ACT IN AERS: 2.0000 | INHALATION_SPRAY | Freq: Four times a day (QID) | RESPIRATORY_TRACT | 2 refills | Status: DC | PRN

## 2022-10-15 NOTE — Progress Notes (Signed)
Synopsis: Referred in February 2024 for dyspnea  Subjective:   PATIENT ID: Andrew Hensley GENDER: male DOB: 1996-08-02, MRN: [id number]   HPI  Chief Complaint  Patient presents with   Consult    Sob. Pt states he can't breathe when he lays on left side or belly. Started around December.     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, vaped during pandemic (nicotine) > uses bong > frequency of use has increased and is contributing  Doesn't really cough much  No bronchitis or pneumonia  He's never been told that   Past Medical History:  Diagnosis Date   Migraine      Family History  Problem Relation Age of Onset   Cancer Paternal Grandfather      Social History   Socioeconomic History   Marital status: Single    Spouse name: Not on file   Number of children: Not on file   Years of education: Not on file   Highest education level: Not on file  Occupational History   Not on file  Tobacco Use   Smoking status: Never   Smokeless tobacco: Never  Substance and Sexual Activity   Alcohol use: Yes    Alcohol/week: 0.0 standard drinks of alcohol   Drug use: No   Sexual activity: Yes  Other Topics Concern   Not on file  Social History Narrative   Not on file   Social Determinants of Health   Financial Resource Strain: Not on file  Food Insecurity: Not on file  Transportation Needs: Not on file  Physical Activity: Not  on file  Stress: Not on file  Social Connections: Not on file  Intimate Partner Violence: Not on file     No Known Allergies   Outpatient Medications Prior to Visit  Medication Sig Dispense Refill   escitalopram (LEXAPRO) 20 MG tablet Take 1 tablet (20 mg total) by mouth daily. (Patient not taking: Reported on 10/15/2022) 90 tablet 1   No facility-administered medications prior to visit.    Review of Systems  Constitutional:  Negative for chills, fever, malaise/fatigue and weight loss.  HENT:  Positive for congestion. Negative for nosebleeds, sinus pain and sore throat.   Eyes:  Negative for photophobia, pain and discharge.  Respiratory:  Positive for shortness of breath. Negative for cough, hemoptysis, sputum production and wheezing.   Cardiovascular:  Negative for chest pain, palpitations, orthopnea and leg swelling.  Gastrointestinal:  Negative for abdominal pain, constipation, diarrhea, nausea and vomiting.  Genitourinary:  Negative for dysuria, frequency, hematuria and urgency.  Musculoskeletal:  Negative for back pain, joint pain, myalgias and neck pain.  Skin:  Negative for itching and rash.  Neurological:  Negative for tingling, tremors, sensory change, speech change, focal weakness, seizures, weakness and headaches.  Psychiatric/Behavioral:  Negative for memory loss, substance abuse and suicidal ideas. The patient is not nervous/anxious.  Objective:  Physical Exam   Vitals:   10/15/22 0957  BP: 134/80  Pulse: (!) 59  Temp: 98 F (36.7 C)  TempSrc: Oral  SpO2: 100%  Weight: 207 lb (93.9 kg)  Height: 6' 1"$  (1.854 m)    Gen: well appearing, no acute distress HENT: NCAT, OP clear, neck supple without masses Eyes: PERRL, EOMi Lymph: no cervical lymphadenopathy PULM: Upper lobe wheezing bilaterally B CV: RRR, no mgr, no JVD GI: BS+, soft, nontender, no hsm Derm: no rash or skin breakdown MSK: normal bulk and tone Neuro: A&Ox4, CN II-XII intact, strength  5/5 in all 4 extremities Psyche: normal mood and affect   CBC    Component Value Date/Time   WBC 5.7 07/07/2018 0910   RBC 5.68 07/07/2018 0910   HGB 16.6 07/07/2018 0910   HCT 48.7 07/07/2018 0910   PLT 242.0 07/07/2018 0910   MCV 85.7 07/07/2018 0910   MCHC 34.0 07/07/2018 0910   RDW 12.7 07/07/2018 0910   LYMPHSABS 2.4 07/07/2018 0910   MONOABS 0.5 07/07/2018 0910   EOSABS 0.1 07/07/2018 0910   BASOSABS 0.0 07/07/2018 0910     Chest imaging:  PFT:  Labs:  Path:  Echo:  Heart Catheterization:       Assessment & Plan:   Shortness of breath - Plan: DG Chest 2 View, Pulmonary function test  Maxillary sinusitis, unspecified chronicity - Plan: CT Maxillofacial LTD WO CM  Wheezing  Marijuana smoker  Discussion: Mr. Boyter presents in the context of sinus congestion and dyspnea mostly from his sinuses being blocked up when he lays flat.  Smoking marijuana contributes to this problem, though he also has some chest heaviness and shortness of breath which may be related to asthma.  He did have some wheezing on exam today when I listen to his lungs.  He likely has allergic rhinitis, exacerbated by smoking.  Plan: Sinus congestion in context of smoking: Stop smoking Use Neil Med rinses with distilled water at least twice per day using the instructions on the package. 1/2 hour after using the [facility] Med rinse, use fluticasone nose spray two puffs in each nostril once per day.  Remember that the fluticasone can take 1-2 weeks to work after regular use. Use generic zyrtec (cetirizine) every day.  If this doesn't help, then stop taking it and use chlorpheniramine-phenylephrine combination tablets. We will order a CT scan of your sinuses.  However, if you noticed after a week or 2 of taking the above medicines that your symptoms have improved then you can cancel the CT of the sinuses.  Smoking: As discussed today it would be helpful for you to stop as your symptoms are  directly related to smoking  Wheezing/chest heaviness: High pretest probability of chronic bronchitis Stop smoking CXR Full lung function test Albuterol as needed for chest tightness wheezing or shortness of breath  Follow-up with [place] in 4 to 6 weeks to go over the results of the lung function test, sooner if needed  Immunizations:  There is no immunization history on file for this patient.   Current Outpatient Medications:    albuterol (VENTOLIN HFA) 108 (90 Base) MCG/ACT inhaler, Inhale 2 puffs into the lungs every 6 (six) hours as needed for wheezing or shortness of breath., Disp: 8 g, Rfl: 2   escitalopram (LEXAPRO) 20 MG tablet, Take 1 tablet (20 mg total) by mouth daily. (Patient not taking: Reported on 10/15/2022), Disp: 90 tablet, Rfl: 1

## 2022-10-15 NOTE — Patient Instructions (Signed)
Sinus congestion in context of smoking: Stop smoking Use Neil Med rinses with distilled water at least twice per day using the instructions on the package. 1/2 hour after using the [facility] Med rinse, use fluticasone nose spray two puffs in each nostril once per day.  Remember that the fluticasone can take 1-2 weeks to work after regular use. Use generic zyrtec (cetirizine) every day.  If this doesn't help, then stop taking it and use chlorpheniramine-phenylephrine combination tablets. We will order a CT scan of your sinuses.  However, if you noticed after a week or 2 of taking the above medicines that your symptoms have improved then you can cancel the CT of the sinuses.  Smoking: As discussed today it would be helpful for you to stop as your symptoms are directly related to smoking  Wheezing/chest heaviness: High pretest probability of chronic bronchitis Stop smoking Full lung function test Albuterol as needed for chest tightness wheezing or shortness of breath  Follow-up with [place] in 4 to 6 weeks to go over the results of the lung function test, sooner if needed

## 2022-10-22 ENCOUNTER — Telehealth: Payer: Self-pay

## 2022-10-22 NOTE — Telephone Encounter (Signed)
-----   Message from Juanito Doom, MD sent at 10/17/2022  8:24 AM EST ----- A, Please let the patient know this was OK Thanks, B

## 2022-10-22 NOTE — Telephone Encounter (Signed)
Spoke with patient. Went over chest xray results. He verbalized understanding nothing further needed.

## 2022-10-22 NOTE — Telephone Encounter (Signed)
Patient is returning a call regarding cxr results.  Please call patient back to discuss.  CB# [phone]

## 2022-11-06 ENCOUNTER — Ambulatory Visit
Admission: RE | Admit: 2022-11-06 | Discharge: 2022-11-06 | Disposition: A | Discharge status: Home / Self Care | Payer: Managed Care, Other (non HMO) | Source: Ambulatory Visit | Attending: Pulmonary Disease | Admitting: Pulmonary Disease

## 2022-11-06 DIAGNOSIS — J320 Chronic maxillary sinusitis: Secondary | ICD-10-CM

## 2022-12-10 ENCOUNTER — Encounter: Payer: Self-pay | Admitting: Adult Health

## 2022-12-10 ENCOUNTER — Ambulatory Visit (INDEPENDENT_AMBULATORY_CARE_PROVIDER_SITE_OTHER): Payer: Managed Care, Other (non HMO) | Admitting: Adult Health

## 2022-12-10 ENCOUNTER — Ambulatory Visit: Payer: Managed Care, Other (non HMO) | Admitting: Internal Medicine

## 2022-12-10 VITALS — BP 112/80 | HR 60 | Temp 98.2°F | Ht 73.0 in | Wt 194.0 lb

## 2022-12-10 DIAGNOSIS — J4520 Mild intermittent asthma, uncomplicated: Secondary | ICD-10-CM | POA: Diagnosis not present

## 2022-12-10 DIAGNOSIS — J0100 Acute maxillary sinusitis, unspecified: Secondary | ICD-10-CM | POA: Diagnosis not present

## 2022-12-10 DIAGNOSIS — R0602 Shortness of breath: Secondary | ICD-10-CM

## 2022-12-10 DIAGNOSIS — J019 Acute sinusitis, unspecified: Secondary | ICD-10-CM | POA: Insufficient documentation

## 2022-12-10 DIAGNOSIS — J45909 Unspecified asthma, uncomplicated: Secondary | ICD-10-CM | POA: Insufficient documentation

## 2022-12-10 LAB — PULMONARY FUNCTION TEST
DL/VA % pred: 112 %
DL/VA: 5.58 ml/min/mmHg/L
DLCO cor % pred: 104 %
DLCO cor: 37.68 ml/min/mmHg
DLCO unc % pred: 104 %
DLCO unc: 37.68 ml/min/mmHg
FEF 25-75 Pre: 3.12 L/sec
FEF2575-%Pred-Pre: 63 %
FEV1-%Pred-Pre: 74 %
FEV1-Pre: 3.68 L
FEV1FVC-%Pred-Pre: 82 %
FEV6-%Pred-Pre: 87 %
FEV6-Pre: 5.23 L
FEV6FVC-%Pred-Pre: 101 %
FVC-%Pred-Pre: 89 %
FVC-Pre: 5.39 L
Pre FEV1/FVC ratio: 68 %
Pre FEV6/FVC Ratio: 100 %
RV % pred: 133 %
RV: 2.3 L
TLC % pred: 103 %
TLC: 7.79 L

## 2022-12-10 MED ORDER — AIRSUPRA 90-80 MCG/ACT IN AERO: 2.0000 | INHALATION_SPRAY | Freq: Four times a day (QID) | RESPIRATORY_TRACT | 3 refills | Status: AC | PRN

## 2022-12-10 MED ORDER — AZITHROMYCIN 250 MG PO TABS: ORAL_TABLET | ORAL | 0 refills | Status: AC

## 2022-12-10 NOTE — Progress Notes (Signed)
Has had [place] first 2 vaccines and a booster.  No flu vaccine for 2023.

## 2022-12-10 NOTE — Patient Instructions (Signed)
Full PFT performed today except Post Spiro. 

## 2022-12-10 NOTE — Assessment & Plan Note (Signed)
Maxillary sinusitis CT sinus shows secretions with bilateral air-fluid levels.-Patient continues to have ongoing nasal congestion.  Will treat with a Z-Pak.  Continue with saline nasal rinses, Flonase and Zyrtec. Refer to ENT if not better  Plan  Patient Instructions  Zpack take as directed.  Saline nasal rinses Twice daily   Zyrtec  daily As needed  drainage  Flonase 2 puffs daily As needed   Change Albuterol inhaler to AirSupra 2 puffs every 6hr as needed wheezing/shortness of breath .  Follow up with Dr. Francine Graven or Breniyah Romm NP in 3 months and As needed     .

## 2022-12-10 NOTE — Patient Instructions (Addendum)
Zpack take as directed.  Saline nasal rinses Twice daily   Zyrtec  daily As needed  drainage  Flonase 2 puffs daily As needed   Change Albuterol inhaler to AirSupra 2 puffs every 6hr as needed wheezing/shortness of breath .  Follow up with Dr. Francine Graven or Domitila Stetler NP in 3 months and As needed

## 2022-12-10 NOTE — Progress Notes (Signed)
  ID: Andrew Hensley, male    DOB: 1996-08-18, 27 y.o.   MRN: [id number]  Chief Complaint  Patient presents with   Follow-up    Referring provider: Deeann Saint, MD  HPI: 27 year old male former seen for pulmonary consult October 15, 2022 for shortness of breath and nasal congestion  TEST/EVENTS :  Spirometry with DLCO December 10, 2022 showed mild to moderate airflow obstruction with FEV1 at 74%, ratio 68, FVC 89%, DLCO 104%.  12/10/2022 Follow up : Dyspnea  Patient returns for 48-month follow-up.  Patient was seen last visit for shortness of breath and significant nasal congestion.  He was having trouble sleeping on his left side and stomach.  Patient also was smoking marijuana and vaping.  Patient had some minimum cough and intermittent wheezing.  Last visit patient was started on albuterol inhaler.  Flonase and Zyrtec.  Patient says it has helped his symptoms.  Took it for about 3 weeks and felt that it nasal congestion and breathing has improved.  Currently has not used for the last several weeks.  Patient was set up for CT sinus November 06, 2022 that showed minimum secretions with air-fluid levels in both maxillary sinuses otherwise clear.  Set up for PFTs that were completed today that showed mild to moderate airflow obstruction with FEV1 at 74%, ratio 68, FVC 89%, DLCO slightly increased at 104%.  No postbronchodilator was given.  Low inspiratory flow volume loop. Patient says his nasal congestion is some better but still has trouble breathing out of his nose intermittently.  Mild discharge on the right side.  Patient says he is active each day.  Feels that he has shortness of breath is decreased.  Patient denies any fever, chest pain, dysphagia, GERD.  Patient has quit smoking and vaping since last visit.  Congratulated on cessation   No Known Allergies   There is no immunization history on file for this patient.  Past Medical History:  Diagnosis Date   Migraine      Tobacco History: Social History   Tobacco Use  Smoking Status Never  Smokeless Tobacco Never  Tobacco Comments   Former use of marijuana and vaping.  No vaping in 2-2.5 years.  12/10/2022 hfb   Counseling given: Not Answered Tobacco comments: Former use of marijuana and vaping.  No vaping in 2-2.5 years.  12/10/2022 hfb   Outpatient Medications Prior to Visit  Medication Sig Dispense Refill   albuterol (VENTOLIN HFA) 108 (90 Base) MCG/ACT inhaler Inhale 2 puffs into the lungs every 6 (six) hours as needed for wheezing or shortness of breath. 8 g 2   escitalopram (LEXAPRO) 20 MG tablet Take 1 tablet (20 mg total) by mouth daily. (Patient not taking: Reported on 10/15/2022) 90 tablet 1   No facility-administered medications prior to visit.     Review of Systems:   Constitutional:   No  weight loss, night sweats,  Fevers, chills, fatigue, or  lassitude.  HEENT:   No headaches,  Difficulty swallowing,  Tooth/dental problems, or  Sore throat,                No sneezing, itching, ear ache,  +nasal congestion, post nasal drip,   CV:  No chest pain,  Orthopnea, PND, swelling in lower extremities, anasarca, dizziness, palpitations, syncope.   GI  No heartburn, indigestion, abdominal pain, nausea, vomiting, diarrhea, change in bowel habits, loss of appetite, bloody stools.   Resp:  No excess mucus, no productive cough,  No  non-productive cough,  No coughing up of blood.  No change in color of mucus.  No wheezing.  No chest wall deformity  Skin: no rash or lesions.  GU: no dysuria, change in color of urine, no urgency or frequency.  No flank pain, no hematuria   MS:  No joint pain or swelling.  No decreased range of motion.  No back pain.    Physical Exam  BP 112/80 (BP Location: Left Arm, Patient Position: Sitting, Cuff Size: Normal)   Pulse 60   Temp 98.2 F (36.8 C) (Oral)   Ht  (1.854 m)   Wt 194 lb (88 kg)   SpO2 100%   BMI 25.60 kg/m   GEN: A/Ox3; pleasant ,  NAD, well nourished    HEENT:  [place]/AT,  EACs-clear, TMs-wnl, NOSE-clear, THROAT-clear, no lesions, no postnasal drip or exudate noted. No stridor   NECK:  Supple w/ fair ROM; no JVD; normal carotid impulses w/o bruits; no thyromegaly or nodules palpated; no lymphadenopathy.    RESP  Clear  P & A; w/o, wheezes/ rales/ or rhonchi. no accessory muscle use, no dullness to percussion  CARD:  RRR, no m/r/g, no peripheral edema, pulses intact, no cyanosis or clubbing.  GI:   Soft & nt; nml bowel sounds; no organomegaly or masses detected.   Musco: Warm bil, no deformities or joint swelling noted.   Neuro: alert, no focal deficits noted.    Skin: Warm, no lesions or rashes    Lab Results:   BNP No results found for: "BNP"  ProBNP No results found for: "PROBNP"  Imaging: No results found.       Latest Ref Rng & Units 12/10/2022   10:07 AM  PFT Results  FVC-Pre L 5.39  P  FVC-Predicted Pre % 89  P  Pre FEV1/FVC % % 68  P  FEV1-Pre L 3.68  P  FEV1-Predicted Pre % 74  P  DLCO uncorrected ml/min/mmHg 37.68  P  DLCO UNC% % 104  P  DLCO corrected ml/min/mmHg 37.68  P  DLCO COR %Predicted % 104  P  DLVA Predicted % 112  P  TLC L 7.79  P  TLC % Predicted % 103  P  RV % Predicted % 133  P    P Preliminary result    No results found for: "NITRICOXIDE"      Assessment & Plan:   Asthma PFTs today show mild to moderate airflow obstruction.  Unfortunately full PFTs with the postbronchodilator were not completed.  Patient symptoms have improved with albuterol.  Will change albuterol over to Airspura inhaler to use as needed.  Asthma action plan discussed.  Continue on management for chronic rhinitis.  Low inspiratory flow volume loops are noted if symptoms continue can consider referral to ENT.  Also FENO was recommended unfortunately not completed will be done on follow-up visit  Plan  Patient Instructions  Zpack take as directed.  Saline nasal rinses Twice daily   Zyrtec   daily As needed  drainage  Flonase 2 puffs daily As needed   Change Albuterol inhaler to AirSupra 2 puffs every 6hr as needed wheezing/shortness of breath .  Follow up with Dr. Francine Graven or Garris Melhorn NP in 3 months and As needed         Acute sinusitis Maxillary sinusitis CT sinus shows secretions with bilateral air-fluid levels.-Patient continues to have ongoing nasal congestion.  Will treat with a Z-Pak.  Continue with saline nasal rinses, Flonase and Zyrtec.  Refer to ENT if not better  Plan  Patient Instructions  Zpack take as directed.  Saline nasal rinses Twice daily   Zyrtec 10mg  daily As needed  drainage  Flonase 2 puffs daily As needed   Change Albuterol inhaler to AirSupra 2 puffs every 6hr as needed wheezing/shortness of breath .  Follow up with Dr. Francine Graven or Shamere Dilworth NP in 3 months and As needed     .     Rubye Oaks, NP 12/10/2022

## 2022-12-10 NOTE — Progress Notes (Signed)
Full PFT performed today except Post Spiro. 

## 2022-12-10 NOTE — Assessment & Plan Note (Signed)
PFTs today show mild to moderate airflow obstruction.  Unfortunately full PFTs with the postbronchodilator were not completed.  Patient symptoms have improved with albuterol.  Will change albuterol over to Airspura inhaler to use as needed.  Asthma action plan discussed.  Continue on management for chronic rhinitis.  Low inspiratory flow volume loops are noted if symptoms continue can consider referral to ENT.  Also FENO was recommended unfortunately not completed will be done on follow-up visit  Plan  Patient Instructions  Zpack take as directed.  Saline nasal rinses Twice daily   Zyrtec  daily As needed  drainage  Flonase 2 puffs daily As needed   Change Albuterol inhaler to AirSupra 2 puffs every 6hr as needed wheezing/shortness of breath .  Follow up with Andrew Hensley or Andrew Bogdanski NP in 3 months and As needed

## 2023-03-13 ENCOUNTER — Ambulatory Visit: Payer: Managed Care, Other (non HMO) | Admitting: Adult Health

## 2023-03-24 ENCOUNTER — Ambulatory Visit: Payer: Managed Care, Other (non HMO) | Admitting: Adult Health
